# Patient Record
Sex: Female | Born: 1995 | Race: Black or African American | Hispanic: No | Marital: Single | Smoking: Never smoker
Health system: Southern US, Community
[De-identification: ages and names within clinical notes are randomized; demographics above are authoritative.]

## PROBLEM LIST (undated history)

## (undated) DIAGNOSIS — R0602 Shortness of breath: Secondary | ICD-10-CM

## (undated) DIAGNOSIS — R52 Pain, unspecified: Secondary | ICD-10-CM

## (undated) DIAGNOSIS — M2550 Pain in unspecified joint: Secondary | ICD-10-CM

## (undated) DIAGNOSIS — G479 Sleep disorder, unspecified: Secondary | ICD-10-CM

## (undated) DIAGNOSIS — M549 Dorsalgia, unspecified: Secondary | ICD-10-CM

## (undated) DIAGNOSIS — F32A Depression, unspecified: Secondary | ICD-10-CM

## (undated) DIAGNOSIS — E282 Polycystic ovarian syndrome: Secondary | ICD-10-CM

## (undated) DIAGNOSIS — F419 Anxiety disorder, unspecified: Secondary | ICD-10-CM

## (undated) DIAGNOSIS — F329 Major depressive disorder, single episode, unspecified: Secondary | ICD-10-CM

## (undated) DIAGNOSIS — R079 Chest pain, unspecified: Secondary | ICD-10-CM

## (undated) DIAGNOSIS — G4730 Sleep apnea, unspecified: Secondary | ICD-10-CM

## (undated) DIAGNOSIS — R600 Localized edema: Secondary | ICD-10-CM

## (undated) DIAGNOSIS — K259 Gastric ulcer, unspecified as acute or chronic, without hemorrhage or perforation: Secondary | ICD-10-CM

## (undated) DIAGNOSIS — R4581 Low self-esteem: Secondary | ICD-10-CM

## (undated) DIAGNOSIS — E559 Vitamin D deficiency, unspecified: Secondary | ICD-10-CM

## (undated) HISTORY — DX: Vitamin D deficiency, unspecified: E55.9

## (undated) HISTORY — DX: Polycystic ovarian syndrome: E28.2

## (undated) HISTORY — DX: Low self-esteem: R45.81

## (undated) HISTORY — DX: Sleep apnea, unspecified: G47.30

## (undated) HISTORY — PX: TONSILLECTOMY: SUR1361

## (undated) HISTORY — DX: Depression, unspecified: F32.A

## (undated) HISTORY — DX: Pain in unspecified joint: M25.50

## (undated) HISTORY — DX: Gastric ulcer, unspecified as acute or chronic, without hemorrhage or perforation: K25.9

## (undated) HISTORY — DX: Sleep disorder, unspecified: G47.9

## (undated) HISTORY — DX: Pain, unspecified: R52

## (undated) HISTORY — DX: Chest pain, unspecified: R07.9

## (undated) HISTORY — DX: Shortness of breath: R06.02

## (undated) HISTORY — DX: Localized edema: R60.0

## (undated) HISTORY — DX: Dorsalgia, unspecified: M54.9

## (undated) HISTORY — DX: Anxiety disorder, unspecified: F41.9

---
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
## 2015-11-22 ENCOUNTER — Emergency Department (HOSPITAL_COMMUNITY)
Admission: EM | Admit: 2015-11-22 | Discharge: 2015-11-22 | Disposition: A | Discharge status: Home / Self Care | Payer: Medicaid Other | Attending: Emergency Medicine | Admitting: Emergency Medicine

## 2015-11-22 ENCOUNTER — Emergency Department (HOSPITAL_COMMUNITY): Payer: Medicaid Other

## 2015-11-22 ENCOUNTER — Encounter (HOSPITAL_COMMUNITY): Payer: Self-pay | Admitting: Emergency Medicine

## 2015-11-22 ENCOUNTER — Ambulatory Visit (HOSPITAL_BASED_OUTPATIENT_CLINIC_OR_DEPARTMENT_OTHER)
Admit: 2015-11-22 | Discharge: 2015-11-22 | Disposition: A | Discharge status: Home / Self Care | Payer: Medicaid Other | Attending: Emergency Medicine | Admitting: Emergency Medicine

## 2015-11-22 DIAGNOSIS — M79662 Pain in left lower leg: Secondary | ICD-10-CM | POA: Diagnosis not present

## 2015-11-22 DIAGNOSIS — R2242 Localized swelling, mass and lump, left lower limb: Secondary | ICD-10-CM | POA: Insufficient documentation

## 2015-11-22 DIAGNOSIS — M79672 Pain in left foot: Secondary | ICD-10-CM | POA: Insufficient documentation

## 2015-11-22 DIAGNOSIS — M79609 Pain in unspecified limb: Secondary | ICD-10-CM | POA: Diagnosis not present

## 2015-11-22 DIAGNOSIS — M79605 Pain in left leg: Secondary | ICD-10-CM

## 2015-11-22 MED ORDER — ACETAMINOPHEN 500 MG PO TABS: 500.0000 mg | ORAL_TABLET | Freq: Four times a day (QID) | ORAL | Status: DC | PRN

## 2015-11-22 MED ORDER — ACETAMINOPHEN 325 MG PO TABS
650.0000 mg | ORAL_TABLET | Freq: Once | ORAL | Status: AC
  Administered 2015-11-22: 650 mg via ORAL
  Filled 2015-11-22: qty 2

## 2015-11-22 NOTE — ED Provider Notes (Signed)
CSN: [id number]     Arrival date & time 11/22/15  1540 History  By signing my name below, I, Iona Beard, attest that this documentation has been prepared under the direction and in the presence of Mady Gemma, PA-C.  Electronically Signed: Iona Beard, ED Scribe 11/22/2015 at 6:17 PM.    Chief Complaint  Patient presents with  . Foot Pain    The history is provided by the patient. No language interpreter was used.    HPI Comments: Colleen Webb is a 20 y.o. female who presents to the Emergency Department complaining of gradual onset, constant, left foot pain, onset four days ago. Pt reports the pain went away this weekend but came back worse this morning. She reports associated left foot swelling and left leg pain below the knee. The pain is worse on the top of her left foot. Pain is worsened with walking and shoots up her left leg. She states she has been working out more frequently the last two weeks and thinks it may be contributing to the pain. Tylenol taken at home with minimal relief to symptoms. No other worsening or alleviating factors noted. Pt denies numbness, tingling, weakness, or any other pertinent symptoms. No hx of cancer, recent travel, or recent surgery. She is not currently on birth control.   History reviewed. No pertinent past medical history. History reviewed. No pertinent past surgical history. No family history on file. Social History  Substance Use Topics  . Smoking status: Never Smoker   . Smokeless tobacco: None  . Alcohol Use: No   OB History    No data available      Review of Systems  Musculoskeletal: Positive for joint swelling and arthralgias.  Neurological: Negative for weakness and numbness.    Allergies  Review of patient's allergies indicates not on file.  Home Medications   Prior to Admission medications   Medication Sig Start Date End Date Taking? Authorizing Provider  acetaminophen (TYLENOL) 500 MG tablet Take 1  tablet (500 mg total) by mouth every 6 (six) hours as needed. 11/22/15   Mady Gemma, PA-C    BP 108/78 mmHg  Pulse 96  Temp(Src) 98.2 F (36.8 C) (Oral)  Resp 12  Ht  (1.549 m)  Wt 298 lb 5 oz (135.314 kg)  BMI 56.39 kg/m2  SpO2 96% Physical Exam  Constitutional: She is oriented to person, place, and time. She appears well-developed and well-nourished. No distress.  HENT:  Head: Normocephalic and atraumatic.  Right Ear: External ear normal.  Left Ear: External ear normal.  Nose: Nose normal.  Eyes: Conjunctivae and EOM are normal. Right eye exhibits no discharge. Left eye exhibits no discharge. No scleral icterus.  Neck: Normal range of motion. Neck supple.  Cardiovascular: Normal rate and regular rhythm.   Pulmonary/Chest: Effort normal and breath sounds normal. No respiratory distress.  Musculoskeletal: Normal range of motion. She exhibits edema and tenderness.  Edema to LLE. Diffuse TTP to LLE from inferior aspect of knee to her foot. No erythema or heat. Full ROM. Distal pulses intact.  Neurological: She is alert and oriented to person, place, and time.  Skin: Skin is warm and dry. She is not diaphoretic.  Psychiatric: She has a normal mood and affect. Her behavior is normal.  Nursing note and vitals reviewed.   ED Course  Procedures (including critical care time)  DIAGNOSTIC STUDIES: Oxygen Saturation is 96% on RA, normal by my interpretation.    COORDINATION OF CARE: 4:08 PM-Discussed  treatment plan which includes tylenol, DG ankle complete left, DG foot complete left, and DG tibia/fibula left with pt at bedside and pt agreed to plan.   Labs Review Labs Reviewed - No data to display  Imaging Review Dg Tibia/fibula Left  11/22/2015  CLINICAL DATA:  Left lower extremity pain for 4 days no known injury. EXAM: LEFT TIBIA AND FIBULA - 2 VIEW COMPARISON:  None. FINDINGS: Imaging of the lower shin and ankle is included on dedicated ankle radiography. There  is no evidence of fracture or focal bone lesion. Negative soft tissues. IMPRESSION: Negative. Electronically Signed   By: Marnee SpringJonathon  Watts M.D.   On: 11/22/2015 16:55   Dg Ankle Complete Left  11/22/2015  CLINICAL DATA:  Left ankle and foot pain for 4 days. No known injury. EXAM: LEFT FOOT - COMPLETE 3+ VIEW; LEFT ANKLE COMPLETE - 3+ VIEW COMPARISON:  None. FINDINGS: Left ankle: The ankle mortise is maintained. No ankle fracture or osteochondral abnormality. No definite ankle joint effusion. The mid and hindfoot bony structures are intact. Left foot: The joint spaces are maintained. No acute bony findings or arthropathic changes. No osteochondral abnormality. IMPRESSION: Normal left foot and ankle radiographs. Electronically Signed   By: Rudie MeyerP.  Gallerani M.D.   On: 11/22/2015 16:56   Dg Foot Complete Left  11/22/2015  CLINICAL DATA:  Left ankle and foot pain for 4 days. No known injury. EXAM: LEFT FOOT - COMPLETE 3+ VIEW; LEFT ANKLE COMPLETE - 3+ VIEW COMPARISON:  None. FINDINGS: Left ankle: The ankle mortise is maintained. No ankle fracture or osteochondral abnormality. No definite ankle joint effusion. The mid and hindfoot bony structures are intact. Left foot: The joint spaces are maintained. No acute bony findings or arthropathic changes. No osteochondral abnormality. IMPRESSION: Normal left foot and ankle radiographs. Electronically Signed   By: Rudie MeyerP.  Gallerani M.D.   On: 11/22/2015 16:56   I have personally reviewed and evaluated these images as part of my medical decision-making.   EKG Interpretation None      MDM   Final diagnoses:  Pain of left lower extremity    20 year old female presents with diffuse LLE pain x 4 days, worse today. Patient is afebrile. Vital signs stable. On exam, she has diffuse TTP to the anterior and posterior aspects of her LLE from the inferior aspect of her knee to her foot with associated edema. No erythema or heat. No palpable cords or varicosities. Full ROM.  Patient is NVI.  Will obtain imaging and DVT study. Will give tylenol.  Imaging of left lower extremity negative for fracture, dislocation, effusion, soft tissue abnormality. DVT study negative.  Discussed findings with patient. Symptoms likely muscular - after speaking with patient further, she states she has been exercising more frequently than normal and thinks this may have precipitated her symptoms. Advised to rest, ice, elevate, and take tylenol or ibuprofen for pain. Patient to follow-up with PCP. Return precautions discussed. Patient verbalizes her understanding and is in agreement with plan.  BP 108/78 mmHg  Pulse 96  Temp(Src) 98.2 F (36.8 C) (Oral)  Resp 12  Ht 5\' 1"  (1.549 m)  Wt 135.314 kg  BMI 56.39 kg/m2  SpO2 96%  LMP      Mady Gemmalizabeth C Westfall, PA-C 11/22/15 1818  Gerhard Munchobert Lockwood, MD 11/22/15 1930

## 2015-11-22 NOTE — ED Notes (Signed)
CONFIRMED PROCEDURE WITH VASCULAR TECH; MAKE SURE PANTS ARE OFF

## 2015-11-22 NOTE — ED Notes (Signed)
CONFIRMED WITH VASCULAR TECH; MAKE SURE PANTS ARE OFF

## 2015-11-22 NOTE — Discharge Instructions (Signed)
1. Medications: tylenol or ibuprofen for pain, usual home medications 2. Treatment: rest, drink plenty of fluids, ice, elevate 3. Follow Up: please followup with your primary doctor for discussion of your diagnoses and further evaluation after today's visit; if you do not have a primary care doctor use the phone number listed in your discharge paperwork to find one; please return to the ER for increased pain or swelling, numbness, new or worsening symptoms   Musculoskeletal Pain Musculoskeletal pain is muscle and boney aches and pains. These pains can occur in any part of the body. Your caregiver may treat you without knowing the cause of the pain. They may treat you if blood or urine tests, X-rays, and other tests were normal.  CAUSES There is often not a definite cause or reason for these pains. These pains may be caused by a type of germ (virus). The discomfort may also come from overuse. Overuse includes working out too hard when your body is not fit. Boney aches also come from weather changes. Bone is sensitive to atmospheric pressure changes. HOME CARE INSTRUCTIONS   Ask when your test results will be ready. Make sure you get your test results.  Only take over-the-counter or prescription medicines for pain, discomfort, or fever as directed by your caregiver. If you were given medications for your condition, do not drive, operate machinery or power tools, or sign legal documents for 24 hours. Do not drink alcohol. Do not take sleeping pills or other medications that may interfere with treatment.  Continue all activities unless the activities cause more pain. When the pain lessens, slowly resume normal activities. Gradually increase the intensity and duration of the activities or exercise.  During periods of severe pain, bed rest may be helpful. Lay or sit in any position that is comfortable.  Putting ice on the injured area.  Put ice in a bag.  Place a towel between your skin and the  bag.  Leave the ice on for 15 to 20 minutes, 3 to 4 times a day.  Follow up with your caregiver for continued problems and no reason can be found for the pain. If the pain becomes worse or does not go away, it may be necessary to repeat tests or do additional testing. Your caregiver may need to look further for a possible cause. SEEK IMMEDIATE MEDICAL CARE IF:  You have pain that is getting worse and is not relieved by medications.  You develop chest pain that is associated with shortness or breath, sweating, feeling sick to your stomach (nauseous), or throw up (vomit).  Your pain becomes localized to the abdomen.  You develop any new symptoms that seem different or that concern you. MAKE SURE YOU:   Understand these instructions.  Will watch your condition.  Will get help right away if you are not doing well or get worse.   This information is not intended to replace advice given to you by your health care provider. Make sure you discuss any questions you have with your health care provider.   Document Released: 08/13/2005 Document Revised: 11/05/2011 Document Reviewed: 04/17/2013 Elsevier Interactive Patient Education [employer] Inc[employer]

## 2015-11-22 NOTE — ED Notes (Signed)
Doppler at bedside at this time

## 2015-11-22 NOTE — ED Notes (Signed)
Pt c/o pain in left foot up to knee.  St's onset last Fri.  Pt denies any injury or recent travel.  Pt increases with ambulation

## 2015-11-22 NOTE — Progress Notes (Signed)
*  PRELIMINARY RESULTS* Vascular Ultrasound Left lower extremity venous duplex has been completed.  Preliminary findings: No evidence of DVT or baker's cyst.   Farrel DemarkJill Eunice, RDMS, RVT  11/22/2015, 6:12 PM

## 2016-03-08 ENCOUNTER — Ambulatory Visit (INDEPENDENT_AMBULATORY_CARE_PROVIDER_SITE_OTHER): Payer: Self-pay | Admitting: Obstetrics & Gynecology

## 2016-03-08 ENCOUNTER — Ambulatory Visit: Payer: Medicaid Other

## 2016-03-08 ENCOUNTER — Encounter: Payer: Self-pay | Admitting: Obstetrics & Gynecology

## 2016-03-08 VITALS — BP 109/68 | HR 85 | Resp 16 | Ht 61.0 in | Wt 297.0 lb

## 2016-03-08 DIAGNOSIS — N912 Amenorrhea, unspecified: Secondary | ICD-10-CM | POA: Insufficient documentation

## 2016-03-08 DIAGNOSIS — E282 Polycystic ovarian syndrome: Secondary | ICD-10-CM

## 2016-03-08 MED ORDER — METFORMIN HCL 1000 MG PO TABS: ORAL_TABLET | ORAL | Status: DC

## 2016-03-08 MED ORDER — MEDROXYPROGESTERONE ACETATE 10 MG PO TABS: 10.0000 mg | ORAL_TABLET | Freq: Every day | ORAL | Status: DC

## 2016-03-08 NOTE — Progress Notes (Signed)
CLINIC ENCOUNTER NOTE  History:  20 y.o. G0P0000 here today to establish care and discuss management of PCOS.  She is accompanied by her mother.  She reports amenorrhea for over one year; was diagnosed with PCOS several years ago. Has been on OCPs and Metformin in the past, did not like side effects of OCPs.  No current medication.  She has started a diet and exercise program and is very committed to it. She denies any abnormal vaginal discharge, pelvic pain or other concerns.   Past Medical History  Diagnosis Date  . PCOS (polycystic ovarian syndrome)     History reviewed. No pertinent past surgical history.  The following portions of the patient's history were reviewed and updated as appropriate: allergies, current medications, past family history, past medical history, past social history, past surgical history and problem list.   Health Maintenance:  Has received HPV vaccine  Review of Systems:  Pertinent items noted in HPI and remainder of comprehensive ROS otherwise negative.  Objective:  Physical Exam BP 109/68 mmHg  Pulse 85  Resp 16  Ht  (1.549 m)  Wt 297 lb (134.718 kg)  BMI 56.15 kg/m2 CONSTITUTIONAL: Well-developed, well-nourished female in no acute distress.  HENT:  Normocephalic, atraumatic. External right and left ear normal. Oropharynx is clear and moist EYES: Conjunctivae and EOM are normal. Pupils are equal, round, and reactive to light. No scleral icterus.  NECK: Normal range of motion, supple, no masses SKIN: Skin is warm and dry. No rash noted. Not diaphoretic. No erythema. No pallor. NEUROLOGIC: Alert and oriented to person, place, and time. Normal reflexes, muscle tone coordination. No cranial nerve deficit noted. PSYCHIATRIC: Normal mood and affect. Normal behavior. Normal judgment and thought content. CARDIOVASCULAR: Normal heart rate noted RESPIRATORY: Effort and breath sounds normal, no problems with respiration noted ABDOMEN: Soft, obese, no  distention noted.   PELVIC: Deferred MUSCULOSKELETAL: Normal range of motion. No edema noted.    Assessment & Plan:  1. PCOS (polycystic ovarian syndrome) 2. Amenorrhea Provera prescribed to induce period. Counseled patient about management of PCOS, commended her on weight loss efforts as this will help with restoring ovulatory cycles, decreases glucose intolerance with improvement of metabolic risk, improves fertility/pregnancy rates and helps with overall health.  Even modest weight loss (5 to 10 percent reduction in body weight) in women with PCOS may result in these effects.  OCPs are also the mainstay of pharmacologic therapy for women with PCOS for managing hyperandrogenism and menstrual dysfunction and for providing contraception; but patient declined this option. PCOS is also treated with Metformin given its association with glucose intolerance and insulin resistance.  Over 50% of PCOS patients on  of Metformin daily have been shown to ovulate successfully. Common side effects include GI intolerance, kidney and liver enzyme irregularities, lactic acidosis. She will also be instructed to stop therapy if she anticipates major stresses, such as surgery or IVF, in order to avoid lactic acidosis. She was prescribed Metformin 1000 mg po daily for one week. If she tolerates this, she can then be switched to 850 mg po bid or 1000 mg po bid.  Will continue to follow.  Patient will return in 3 months for followup and CMET check.  - metFORMIN (GLUCOPHAGE) 1000 MG tablet; Take one tablet by mouth daily for one week. Then increase to one tablet twice a day.  Dispense: 60 tablet; Refill: 3 - medroxyPROGESTERone (PROVERA) 10 MG tablet; Take 1 tablet (10 mg total) by mouth daily. Use for  ten days  Dispense: 10 tablet; Refill: 2 Routine preventative health maintenance measures emphasized. Please refer to After Visit Summary for other counseling recommendations.   Total face-to-face time with patient:  20 minutes. Over 50% of encounter was spent on counseling and coordination of care.   Jaynie CollinsUGONNA  Mattie Novosel, MD, FACOG Attending Obstetrician & Gynecologist, [facility]

## 2016-03-08 NOTE — Patient Instructions (Addendum)
Thank you for enrolling in MyChart. Please follow the instructions below to securely access your online medical record. MyChart allows you to send messages to your doctor, view your test results, renew your prescriptions, schedule appointments, and more.  How Do I Sign Up? 1. In your Internet browser, go to [email or web address]. 2. Click on the New  User? link in the Sign In box.  3. Enter your MyChart Access Code exactly as it appears below. You will not need to use this code after you have completed the sign-up process. If you do not sign up before the expiration date, you must request a new code. MyChart Access Code: [email or web address] Expires: 03/30/2016  3:09 AM  4. Enter the last four digits of your Social Security Number (xxxx) and Date of Birth (mm/dd/yyyy) as indicated and click Next. You will be taken to the next sign-up page. 5. Create a MyChart ID. This will be your MyChart login ID and cannot be changed, so think of one that is secure and easy to remember. 6. Create a MyChart password. You can change your password at any time. 7. Enter your Password Reset Question and Answer and click Next. This can be used at a later time if you forget your password.  8. Select your communication preference, and if applicable enter your e-mail address. You will receive e-mail notification when new information is available in MyChart by choosing to receive e-mail notifications and filling in your e-mail. 9. Click Sign In. You can now view your medical record.   Additional Information If you have questions, you can email [email or web address] or call [phone] to talk to our MyChart staff. Remember, MyChart is NOT to be used for urgent needs. For medical emergencies, dial 911.  Polycystic Ovarian Syndrome Polycystic ovarian syndrome (PCOS) is a common hormonal disorder among women of reproductive age. Most women with PCOS grow many small cysts on their ovaries. PCOS can  cause problems with your periods and make it difficult to get pregnant. It can also cause an increased risk of miscarriage with pregnancy. If left untreated, PCOS can lead to serious health problems, such as diabetes and heart disease. CAUSES The cause of PCOS is not fully understood, but genetics may be a factor. SIGNS AND SYMPTOMS   Infrequent or no menstrual periods.   Inability to get pregnant (infertility) because of not ovulating.   Increased growth of hair on the face, chest, stomach, back, thumbs, thighs, or toes.   Acne, oily skin, or dandruff.   Pelvic pain.   Weight gain or obesity, usually carrying extra weight around the waist.   Type 2 diabetes.   High cholesterol.   High blood pressure.   Female-pattern baldness or thinning hair.   Patches of thickened and dark brown or black skin on the neck, arms, breasts, or thighs.   Tiny excess flaps of skin (skin tags) in the armpits or neck area.   Excessive snoring and having breathing stop at times while asleep (sleep apnea).   Deepening of the voice.   Gestational diabetes when pregnant.  DIAGNOSIS  There is no single test to diagnose PCOS.   Your health care provider will:   Take a medical history.   Perform a pelvic exam.   Have ultrasonography done.   Check your female and female hormone levels.   Measure glucose or sugar levels in the blood.   Do other blood tests.   If you are producing too many female hormones, your  health care provider will make sure it is from PCOS. At the physical exam, your health care provider will want to evaluate the areas of increased hair growth. Try to allow natural hair growth for a few days before the visit.   During a pelvic exam, the ovaries may be enlarged or swollen because of the increased number of small cysts. This can be seen more easily by using vaginal ultrasonography or screening to examine the ovaries and lining of the uterus (endometrium)  for cysts. The uterine lining may become thicker if you have not been having a regular period.  TREATMENT  Because there is no cure for PCOS, it needs to be managed to prevent problems. Treatments are based on your symptoms. Treatment is also based on whether you want to have a baby or whether you need contraception.  Treatment may include:   Progesterone hormone to start a menstrual period.   Birth control pills to make you have regular menstrual periods.   Medicines to make you ovulate, if you want to get pregnant.   Medicines to control your insulin.   Medicine to control your blood pressure.   Medicine and diet to control your high cholesterol and triglycerides in your blood.  Medicine to reduce excessive hair growth.  Surgery, making small holes in the ovary, to decrease the amount of female hormone production. This is done through a long, lighted tube (laparoscope) placed into the pelvis through a tiny incision in the lower abdomen.  HOME CARE INSTRUCTIONS  Only take over-the-counter or prescription medicine as directed by your health care provider.  Pay attention to the foods you eat and your activity levels. This can help reduce the effects of PCOS.  Keep your weight under control.  Eat foods that are low in carbohydrate and high in fiber.  Exercise regularly. SEEK MEDICAL CARE IF:  Your symptoms do not get better with medicine.  You have new symptoms.   This information is not intended to replace advice given to you by your health care provider. Make sure you discuss any questions you have with your health care provider.   Document Released: 12/07/2004 Document Revised: 06/03/2013 Document Reviewed: 01/29/2013 Elsevier Interactive Patient Education [employer]

## 2016-10-17 ENCOUNTER — Emergency Department (HOSPITAL_COMMUNITY)
Admission: EM | Admit: 2016-10-17 | Discharge: 2016-10-17 | Disposition: A | Discharge status: Home / Self Care | Payer: Medicaid Other | Attending: Emergency Medicine | Admitting: Emergency Medicine

## 2016-10-17 ENCOUNTER — Encounter (HOSPITAL_COMMUNITY): Payer: Self-pay | Admitting: Emergency Medicine

## 2016-10-17 DIAGNOSIS — Z79899 Other long term (current) drug therapy: Secondary | ICD-10-CM | POA: Insufficient documentation

## 2016-10-17 DIAGNOSIS — L509 Urticaria, unspecified: Secondary | ICD-10-CM | POA: Insufficient documentation

## 2016-10-17 MED ORDER — FAMOTIDINE 20 MG PO TABS
20.0000 mg | ORAL_TABLET | Freq: Once | ORAL | Status: AC
  Administered 2016-10-17: 20 mg via ORAL
  Filled 2016-10-17: qty 1

## 2016-10-17 MED ORDER — DIPHENHYDRAMINE HCL 25 MG PO CAPS
50.0000 mg | ORAL_CAPSULE | Freq: Once | ORAL | Status: AC
  Administered 2016-10-17: 50 mg via ORAL
  Filled 2016-10-17: qty 2

## 2016-10-17 NOTE — Discharge Instructions (Signed)
1 to 2 tablets of 25 mg Benadryl pills every 4-6 hours as needed to a maximum of 300 mg per day. In addition, you may apply a topical hydrocortisone ointment to all affected areas except for the face.  ° °Do not hesitate to call 911 or return to the emergency room if you develop any shortness of breath, wheezing, tongue or lip swelling. ° °

## 2016-10-17 NOTE — ED Triage Notes (Signed)
Pt from home with complaints of urticaria on her chest, arms, and back. Pt denies SOB or any swelling in her airway. Pt states she ate pesto for the first time yesterday. Pt has clear lung sounds. Pt took benadryl yesterday, but nothing today. Pt states the benadryl helped. Pt states hives have gotten worse as the day has gone on

## 2016-10-18 NOTE — ED Provider Notes (Signed)
[facility]-EMERGENCY DEPT Provider Note   CSN: [id number] Arrival date & time: 10/17/16  2206     History   Chief Complaint Chief Complaint  Patient presents with  . Urticaria     HPI   Blood pressure 109/63, pulse 101, temperature 99.1 F (37.3 C), temperature source Oral, resp. rate 18, height 5\' 1"  (1.549 m), weight 132 kg, last menstrual period 04/16/2016, SpO2 98 %.  Colleen Webb is a 21 y.o. female complaining of Pruritic urticarial rash onset yesterday. She denies any new soaps, lotions, detergents, medications, pets, foods. Also denies nausea, vomiting, lip or tongue swelling. No history of anaphylactic reaction. She took Benadryl yesterday with some relief. Affected areas are above the waist only.  Past Medical History:  Diagnosis Date  . PCOS (polycystic ovarian syndrome)     Patient Active Problem List   Diagnosis Date Noted  . PCOS (polycystic ovarian syndrome) 03/08/2016  . Amenorrhea 03/08/2016    History reviewed. No pertinent surgical history.  OB History    Gravida Para Term Preterm AB Living   0 0 0 0 0 0   SAB TAB Ectopic Multiple Live Births   0 0 0 0         Home Medications    Prior to Admission medications   Medication Sig Start Date End Date Taking? Authorizing Provider  medroxyPROGESTERone (PROVERA) 10 MG tablet Take 1 tablet (10 mg total) by mouth daily. Use for ten days 03/08/16   Tereso Newcomer, MD  metFORMIN (GLUCOPHAGE) 1000 MG tablet Take one tablet by mouth daily for one week. Then increase to one tablet twice a day. 03/08/16   Tereso Newcomer, MD    Family History Family History  Problem Relation Age of Onset  . Diabetes Maternal Grandfather   . Diabetes Paternal Grandfather     Social History Social History  Substance Use Topics  . Smoking status: Never Smoker  . Smokeless tobacco: Never Used  . Alcohol use No     Allergies   Patient has no known allergies.   Review of Systems Review of Systems  10 systems  reviewed and found to be negative, except as noted in the HPI.    Physical Exam Updated Vital Signs BP 109/63 (BP Location: Left Arm)   Pulse 101   Temp 99.1 F (37.3 C) (Oral)   Resp 18   Ht 5\' 1"  (1.549 m)   Wt 132 kg   LMP 04/16/2016 Comment: Pt on hormone therapy   SpO2 98%   BMI 54.98 kg/m   Physical Exam  Constitutional: She is oriented to person, place, and time. She appears well-developed and well-nourished. No distress.  HENT:  Head: Normocephalic and atraumatic.  Mouth/Throat: Oropharynx is clear and moist.  Eyes: Conjunctivae and EOM are normal. Pupils are equal, round, and reactive to light.  Neck: Normal range of motion.  Cardiovascular: Normal rate, regular rhythm and intact distal pulses.   Pulmonary/Chest: Effort normal and breath sounds normal.  Abdominal: Soft. She exhibits no distension and no mass. There is no tenderness. There is no rebound and no guarding. No hernia.  Musculoskeletal: Normal range of motion.  Neurological: She is alert and oriented to person, place, and time.  Skin: Skin is warm. Capillary refill takes less than 2 seconds. Rash noted. She is not diaphoretic.  Uricareal rash to bilateral upper extremities and torso.  Psychiatric: She has a normal mood and affect.  Nursing note and vitals reviewed.    ED Treatments /  Results  Labs (all labs ordered are listed, but only abnormal results are displayed) Labs Reviewed - No data to display  EKG  EKG Interpretation None       Radiology No results found.  Procedures Procedures (including critical care time)  Medications Ordered in ED Medications  famotidine (PEPCID) tablet 20 mg (20 mg Oral Given 10/17/16 2309)  diphenhydrAMINE (BENADRYL) capsule 50 mg (50 mg Oral Given 10/17/16 2309)     Initial Impression / Assessment and Plan / ED Course  I have reviewed the triage vital signs and the nursing notes.  Pertinent labs & imaging results that were available during my care of  the patient were reviewed by me and considered in my medical decision making (see chart for details).     Vitals:   10/17/16 2218  BP: 109/63  Pulse: 101  Resp: 18  Temp: 99.1 F (37.3 C)  TempSrc: Oral  SpO2: 98%  Weight: 132 kg  Height: 5\' 1"  (1.549 m)    Medications  famotidine (PEPCID) tablet 20 mg (20 mg Oral Given 10/17/16 2309)  diphenhydrAMINE (BENADRYL) capsule 50 mg (50 mg Oral Given 10/17/16 2309)    Colleen Webb is 21 y.o. female presenting with hives. No signs of secondary organ involvement. Counseled patient on applying hydrocortisone ointment  Evaluation does not show pathology that would require ongoing emergent intervention or inpatient treatment. Pt is hemodynamically stable and mentating appropriately. Discussed findings and plan with patient/guardian, who agrees with care plan. All questions answered. Return precautions discussed and outpatient follow up given.      Final Clinical Impressions(s) / ED Diagnoses   Final diagnoses:  Urticaria    New Prescriptions Discharge Medication List as of 10/17/2016 10:58 PM       Wynetta EmeryNicole Wymon Swaney, PA-C 10/18/16 0030    Rolland PorterMark James, MD 10/29/16 2005

## 2017-02-17 IMAGING — CR DG ANKLE COMPLETE 3+V*L*
3 series · 3 of 3 positions shown · non-contrast
Comparison: None.

CLINICAL DATA: Left ankle and foot pain for 4 days. No known
injury.

EXAM:
LEFT FOOT - COMPLETE 3+ VIEW; LEFT ANKLE COMPLETE - 3+ VIEW

[ankle ap]
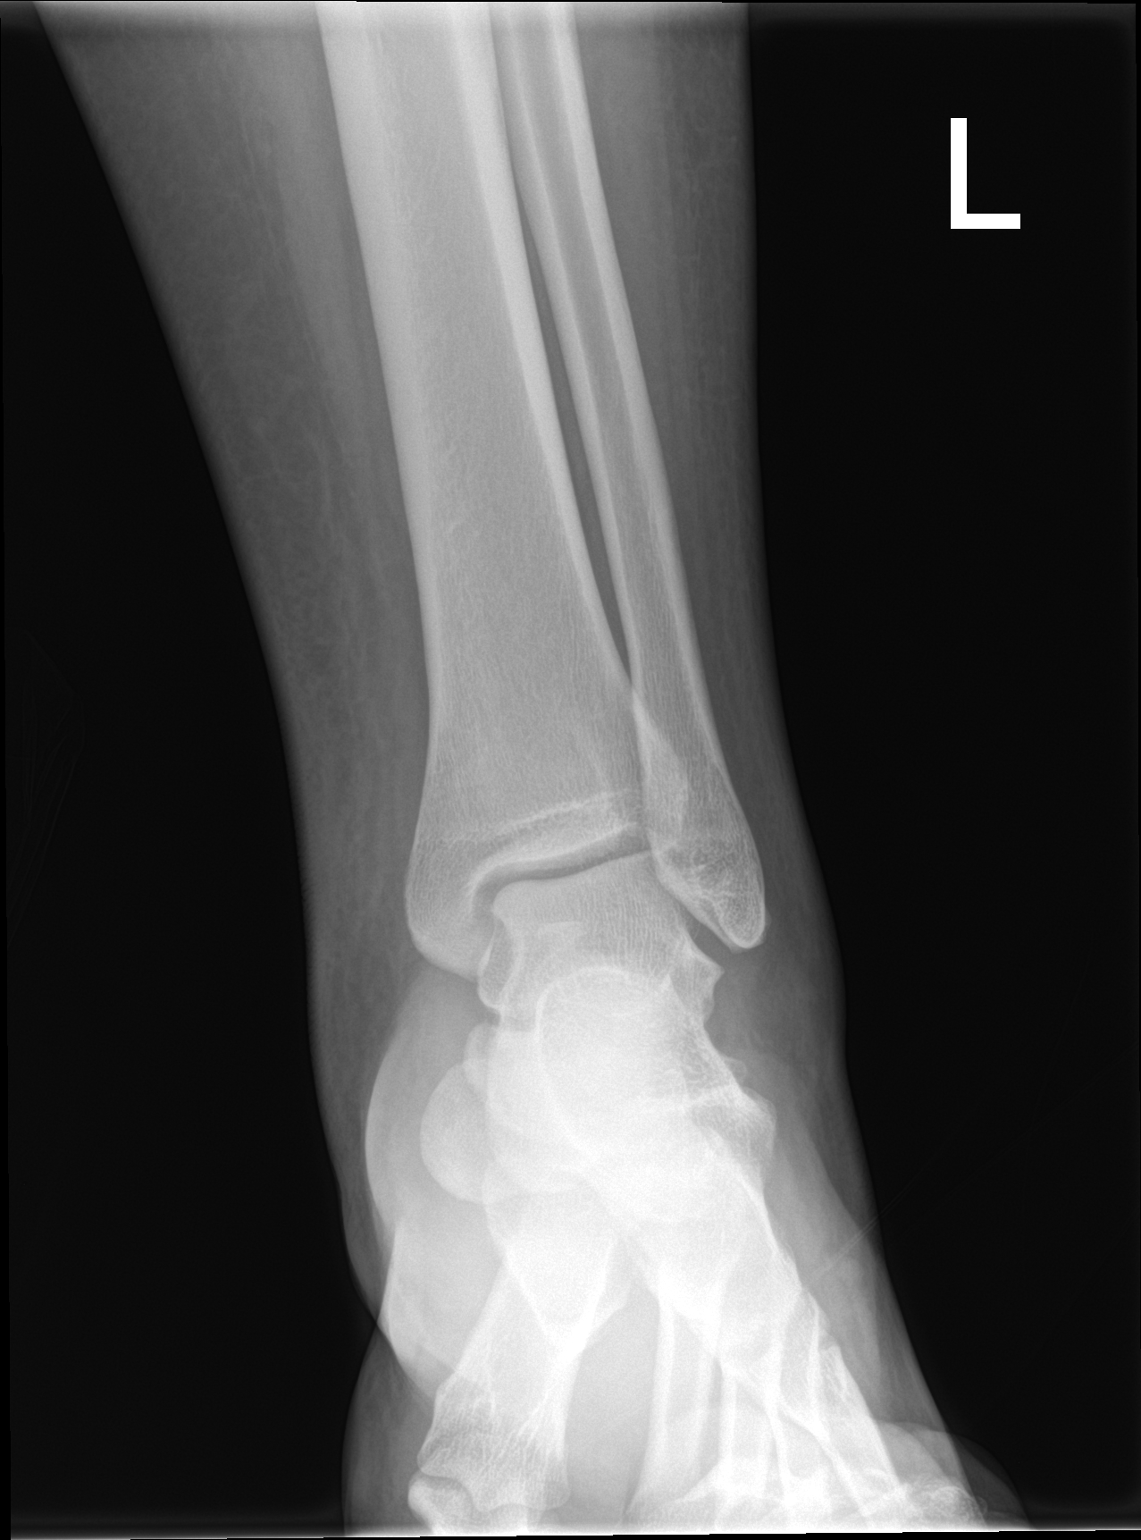

[ankle obl]
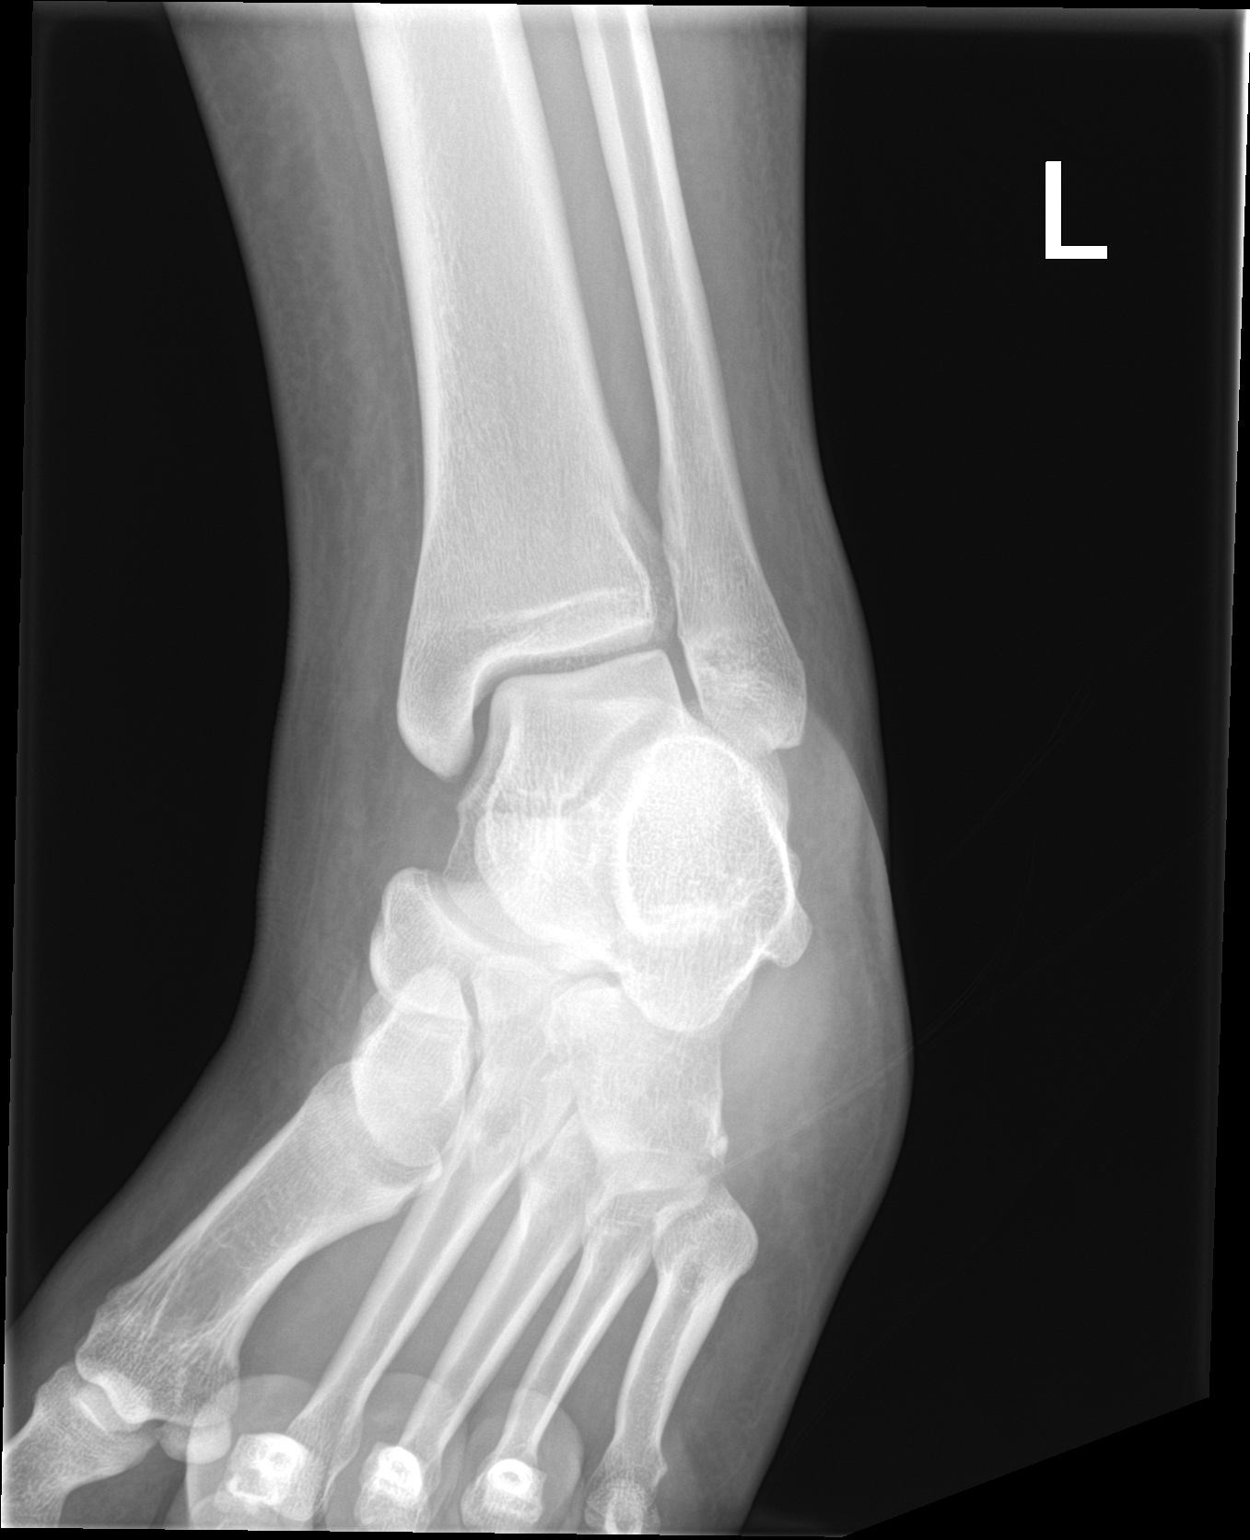

[ankle lat]
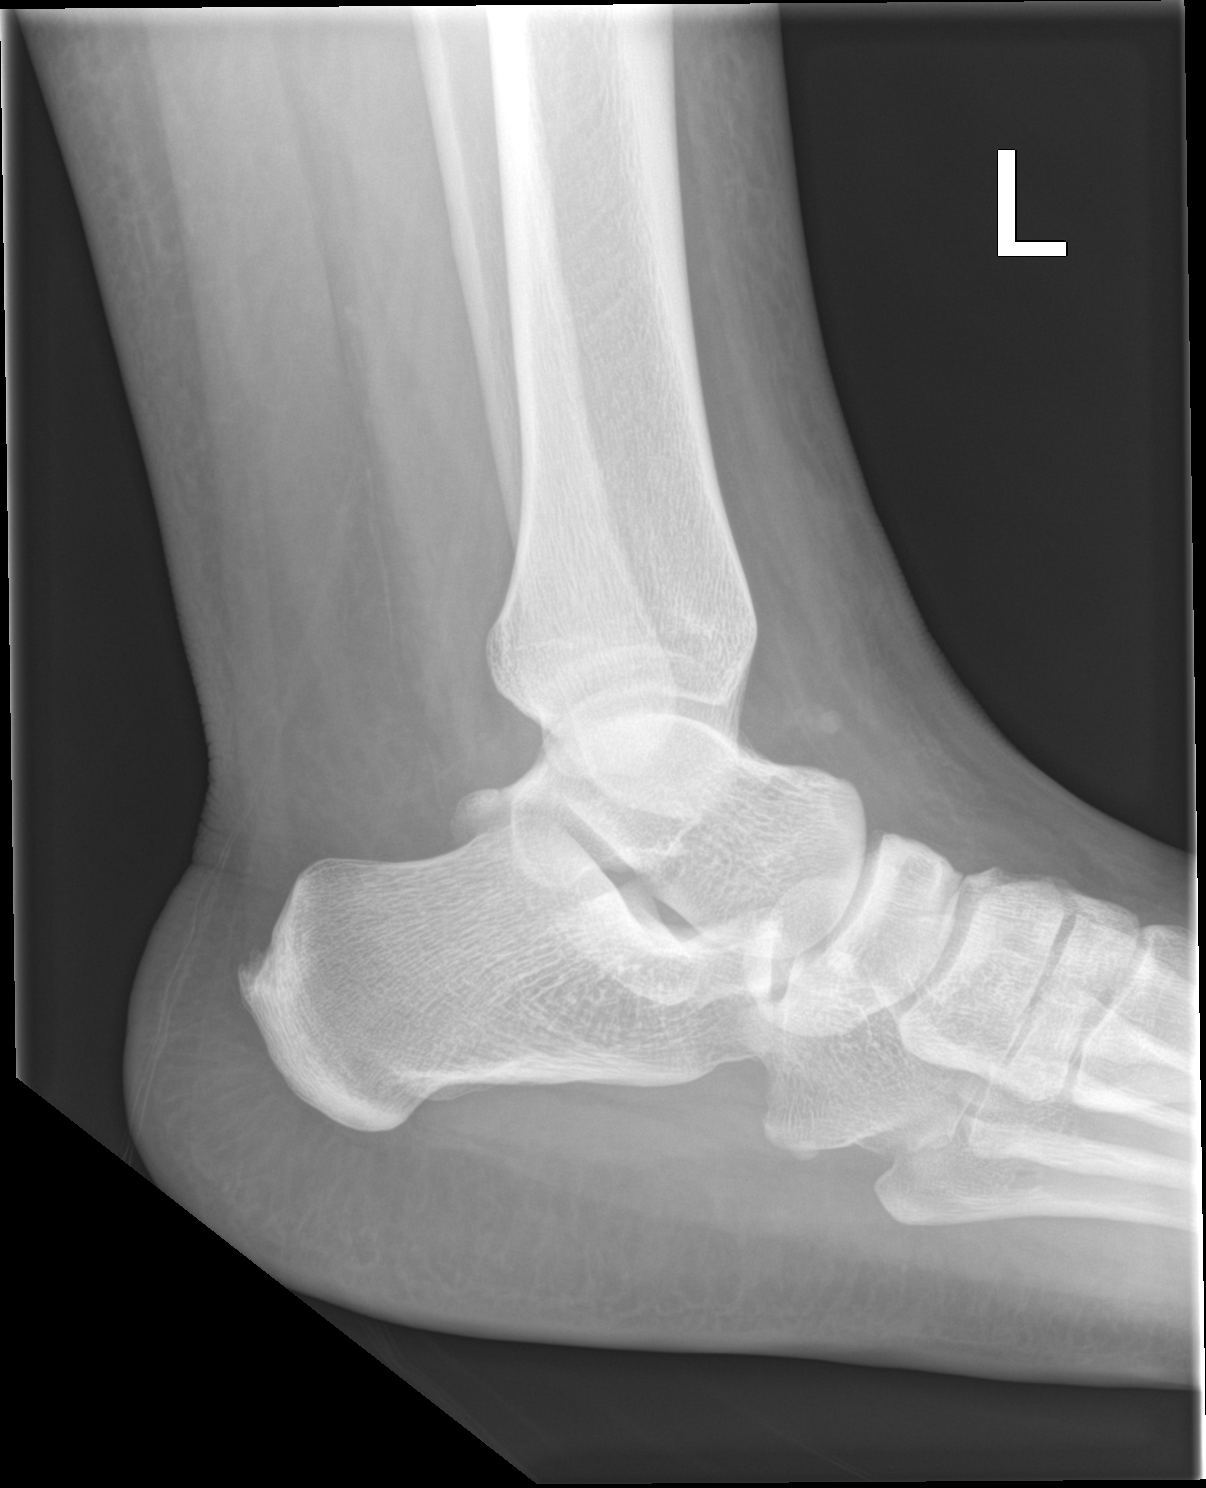

[3 of 3 positions shown; findings below may reference images not displayed]

FINDINGS: Left ankle:

The ankle mortise is maintained. No ankle fracture or osteochondral
abnormality. No definite ankle joint effusion. The mid and hindfoot
bony structures are intact.

Left foot:

The joint spaces are maintained. No acute bony findings or
arthropathic changes. No osteochondral abnormality.
IMPRESSION: Normal left foot and ankle radiographs.

## 2017-02-17 IMAGING — CR DG FOOT COMPLETE 3+V*L*
3 series · 3 of 3 positions shown · non-contrast
Comparison: None.

CLINICAL DATA: Left ankle and foot pain for 4 days. No known
injury.

EXAM:
LEFT FOOT - COMPLETE 3+ VIEW; LEFT ANKLE COMPLETE - 3+ VIEW

[foot ap]
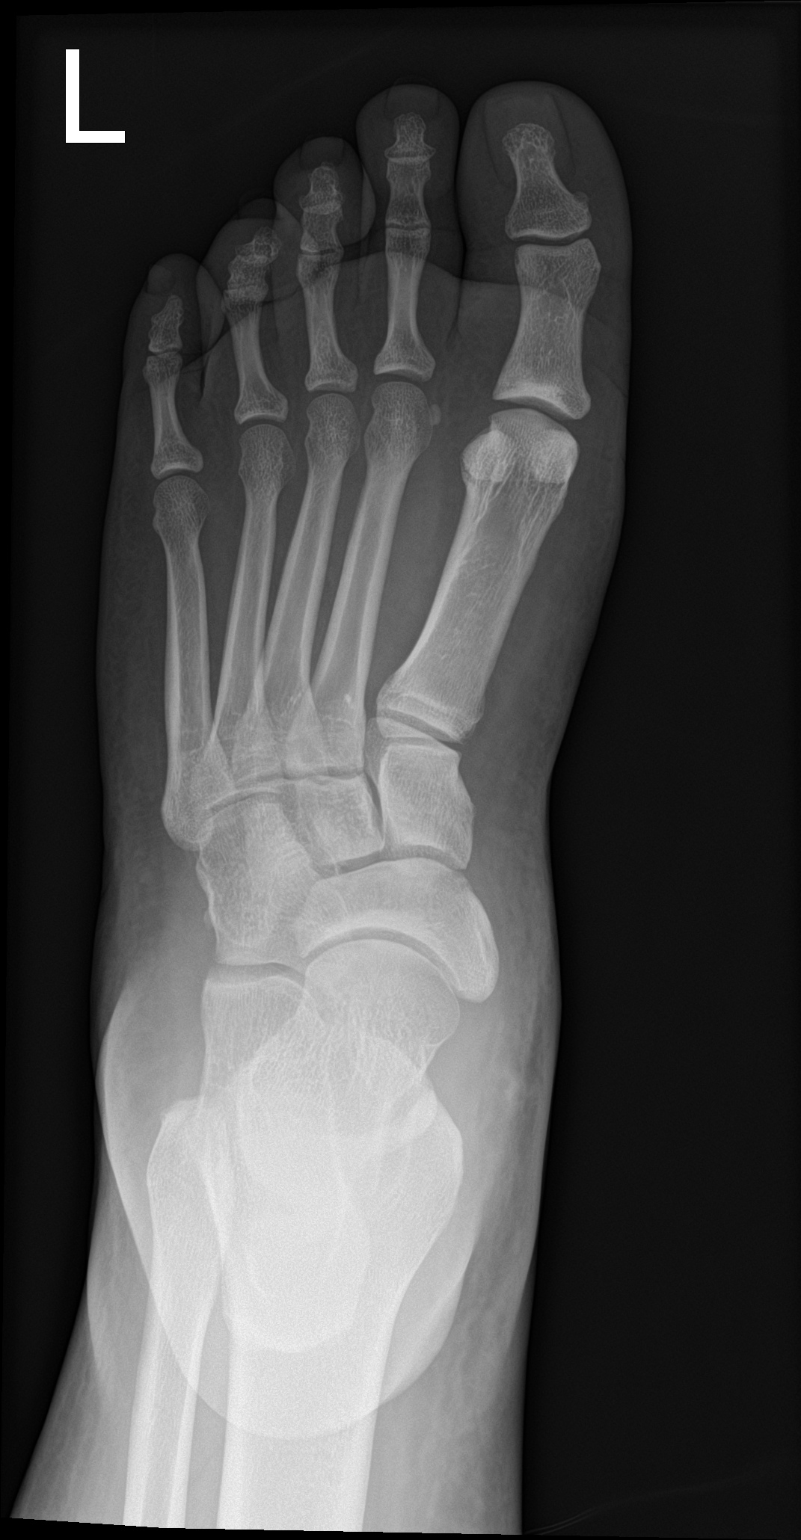

[foot obl]
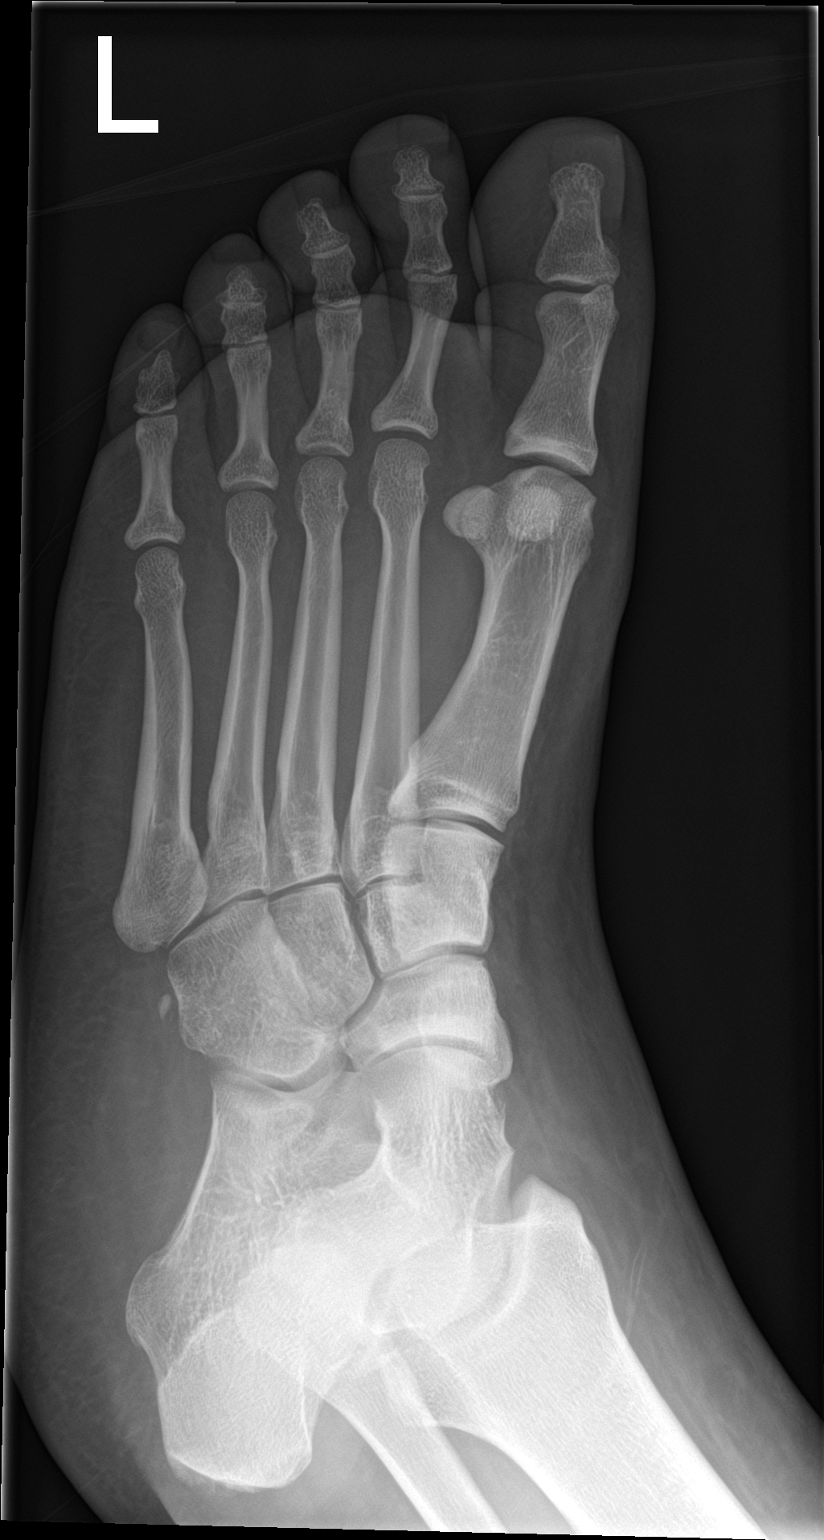

[foot lat]
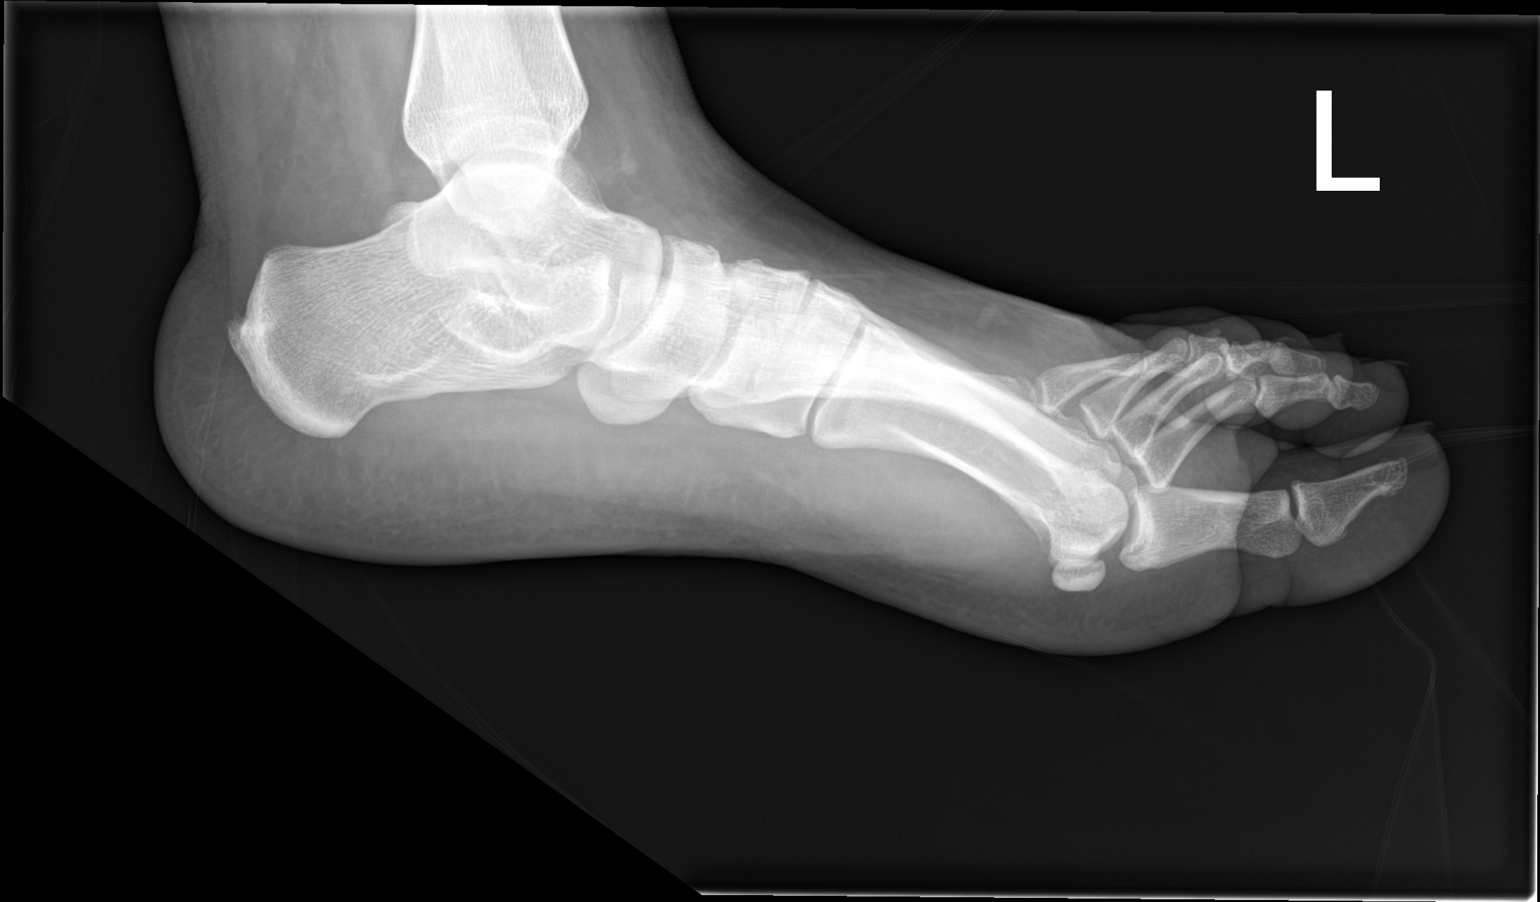

[3 of 3 positions shown; findings below may reference images not displayed]

FINDINGS: Left ankle:

The ankle mortise is maintained. No ankle fracture or osteochondral
abnormality. No definite ankle joint effusion. The mid and hindfoot
bony structures are intact.

Left foot:

The joint spaces are maintained. No acute bony findings or
arthropathic changes. No osteochondral abnormality.
IMPRESSION: Normal left foot and ankle radiographs.

## 2018-07-27 ENCOUNTER — Emergency Department (HOSPITAL_COMMUNITY)
Admission: EM | Admit: 2018-07-27 | Discharge: 2018-07-27 | Disposition: A | Discharge status: Home / Self Care | Payer: BLUE CROSS/BLUE SHIELD | Attending: Emergency Medicine | Admitting: Emergency Medicine

## 2018-07-27 ENCOUNTER — Encounter: Payer: Self-pay | Admitting: Emergency Medicine

## 2018-07-27 ENCOUNTER — Emergency Department (HOSPITAL_COMMUNITY): Payer: BLUE CROSS/BLUE SHIELD

## 2018-07-27 DIAGNOSIS — R05 Cough: Secondary | ICD-10-CM | POA: Diagnosis present

## 2018-07-27 DIAGNOSIS — Z79899 Other long term (current) drug therapy: Secondary | ICD-10-CM | POA: Diagnosis not present

## 2018-07-27 DIAGNOSIS — J069 Acute upper respiratory infection, unspecified: Secondary | ICD-10-CM | POA: Insufficient documentation

## 2018-07-27 DIAGNOSIS — Z7984 Long term (current) use of oral hypoglycemic drugs: Secondary | ICD-10-CM | POA: Insufficient documentation

## 2018-07-27 MED ORDER — FLUTICASONE PROPIONATE 50 MCG/ACT NA SUSP: 1.0000 | Freq: Every day | NASAL | 2 refills | Status: DC

## 2018-07-27 MED ORDER — ALBUTEROL SULFATE HFA 108 (90 BASE) MCG/ACT IN AERS: 1.0000 | INHALATION_SPRAY | Freq: Four times a day (QID) | RESPIRATORY_TRACT | 0 refills | Status: DC | PRN

## 2018-07-27 NOTE — Discharge Instructions (Addendum)
Use albuterol inhaler as directed.  Take Flonase as directed.  You can take Tylenol or Ibuprofen as directed for pain. You can alternate Tylenol and Ibuprofen every 4 hours. If you take Tylenol at 1pm, then you can take Ibuprofen at 5pm. Then you can take Tylenol again at 9pm.   Make sure you are drinking plenty of fluids and staying hydrated.  Return to emergency department for any fever despite medications, vomiting, difficulty breathing, chest pain or any other worsening or concerning symptoms.  Follow[facility]up with [facility] to establish a primary care doctor if you do not have one.

## 2018-07-27 NOTE — ED Triage Notes (Signed)
Patient c/o cough with bloody muscous that started this morning with tightness in chest. Patient has been congested since last Sunday.   A/Ox4.

## 2018-07-27 NOTE — ED Provider Notes (Signed)
[facility]-EMERGENCY DEPT Provider Note   CSN: [id number] Arrival date & time: 07/27/18  1257     History   Chief Complaint Chief Complaint  Patient presents with  . Cough  . Nasal Congestion    HPI Colleen Webb is a 22 y.o. female with past medical history of PCOS who presents for evaluation of 1 week of nasal congestion, rhinorrhea, cough.  Patient reports that on onset of symptoms, she had measured fever of 101.0.  She states she has not had fever in several days.  She states that she still continues to have nasal congestion, cough.  She reports cough is productive of phlegm with blood-tinged mucus.  No gross hemoptysis noted.  Patient states she has some chest tightness associated when she coughs.  No chest tightness or pain at rest.  No difficulty breathing.  She reports she has taken over-the-counter Vicks VapoRub, nasal spray with minimal improvement in symptoms.  She has not tried any other medications.  Patient denies any history of asthma.  She is not a current smoker or fever.  Patient also reports she has had some mild sore throat but she thinks it is related to coughing so much.  She has been able to tolerate her secretions and p.o. without any difficulty.  Patient denies any nausea/vomiting, difficulty breathing.  The history is provided by the patient.    Past Medical History:  Diagnosis Date  . PCOS (polycystic ovarian syndrome)     Patient Active Problem List   Diagnosis Date Noted  . PCOS (polycystic ovarian syndrome) 03/08/2016  . Amenorrhea 03/08/2016    History reviewed. No pertinent surgical history.   OB History    Gravida  0   Para  0   Term  0   Preterm  0   AB  0   Living  0     SAB  0   TAB  0   Ectopic  0   Multiple  0   Live Births               Home Medications    Prior to Admission medications   Medication Sig Start Date End Date Taking? Authorizing Provider  albuterol (PROVENTIL HFA;VENTOLIN  HFA) 108 (90 Base) MCG/ACT inhaler Inhale 1-2 puffs into the lungs every 6 (six) hours as needed for wheezing or shortness of breath. 07/27/18   Maxwell CaulLayden,  A, PA-C  fluticasone (FLONASE) 50 MCG/ACT nasal spray Place 1 spray into both nostrils daily. 07/27/18   Maxwell CaulLayden,  A, PA-C  medroxyPROGESTERone (PROVERA) 10 MG tablet Take 1 tablet (10 mg total) by mouth daily. Use for ten days 03/08/16   Anyanwu, Jethro BastosUgonna A, MD  metFORMIN (GLUCOPHAGE) 1000 MG tablet Take one tablet by mouth daily for one week. Then increase to one tablet twice a day. 03/08/16   Tereso NewcomerAnyanwu, Ugonna A, MD    Family History Family History  Problem Relation Age of Onset  . Diabetes Maternal Grandfather   . Diabetes Paternal Grandfather     Social History Social History   Tobacco Use  . Smoking status: Never Smoker  . Smokeless tobacco: Never Used  Substance Use Topics  . Alcohol use: No    Alcohol/week: 0.0 standard drinks  . Drug use: No     Allergies   Patient has no known allergies.   Review of Systems Review of Systems  Constitutional: Negative for fever.  HENT: Positive for congestion, rhinorrhea and sore throat. Negative for trouble swallowing.  Respiratory: Positive for cough and chest tightness. Negative for shortness of breath.   Cardiovascular: Negative for chest pain.  Gastrointestinal: Positive for abdominal pain. Negative for nausea and vomiting.  All other systems reviewed and are negative.    Physical Exam Updated Vital Signs BP (!) 138/92   Pulse 99   Temp 98.2 F (36.8 C) (Oral)   Resp 18   LMP 05/23/2018 (Within Weeks)   SpO2 97%   Physical Exam  Constitutional: She appears well-developed and well-nourished.  HENT:  Head: Normocephalic and atraumatic.  Nose: Mucosal edema present.  Mouth/Throat: Uvula is midline. No trismus in the jaw. Posterior oropharyngeal erythema present.  Edematous and erythematous nasal turbinates bilaterally.  Airways patent, phonation is intact.   Posterior oropharynx with slight erythema.  No edema, tonsillar exudates.  Uvula is midline.  No trismus.  Eyes: Conjunctivae and EOM are normal. Right eye exhibits no discharge. Left eye exhibits no discharge. No scleral icterus.  Pulmonary/Chest: Effort normal and breath sounds normal.  Able to speak in full sentences without any difficulty.  No evidence of respiratory distress.  Lungs clear to auscultation bilaterally.  No evidence of wheezing.  Neurological: She is alert.  Skin: Skin is warm and dry.  Psychiatric: She has a normal mood and affect. Her speech is normal and behavior is normal.  Nursing note and vitals reviewed.    ED Treatments / Results  Labs (all labs ordered are listed, but only abnormal results are displayed) Labs Reviewed - No data to display  EKG None  Radiology Dg Chest 2 View  Result Date: 07/27/2018 CLINICAL DATA:  Cough, chest tightness EXAM: CHEST - 2 VIEW COMPARISON:  None. FINDINGS: The heart size and mediastinal contours are within normal limits. Both lungs are clear. The visualized skeletal structures are unremarkable. IMPRESSION: No active cardiopulmonary disease. Electronically Signed   By: Judie Petit.  Shick M.D.   On: 07/27/2018 14:12    Procedures Procedures (including critical care time)  Medications Ordered in ED Medications - No data to display   Initial Impression / Assessment and Plan / ED Course  I have reviewed the triage vital signs and the nursing notes.  Pertinent labs & imaging results that were available during my care of the patient were reviewed by me and considered in my medical decision making (see chart for details).     22 year old female who presents for evaluation of 1 week of nasal congestion, rhinorrhea, cough.  Also reports some associated chest soreness.  She states that initially she had fever but that that improved.  Continues to have cough, prompting ED visit. Patient is afebrile, non-toxic appearing, sitting comfortably  on examination table. Vital signs reviewed and stable.  Consider URI versus bronchitis.  Low suspicion for pneumonia but given fever and cough, will plan for x-ray.  Also consider flu though lower suspicion.  History/physical exam is not concerning for ACS etiology, PE.  X-ray reviewed.  No evidence of acute infectious etiology.  I discussed with patient that this is most likely bronchitis.  I discussed with her that it could have been the flu given her fever but that there would be no acute intervention needed since she is past the 48-hour window of treatment.  We will plan for supportive at home therapies.  Encourage primary care follow-up. Patient had ample opportunity for questions and discussion. All patient's questions were answered with full understanding. Strict return precautions discussed. Patient expresses understanding and agreement to plan.   Final Clinical Impressions(s) / ED  Diagnoses   Final diagnoses:  Upper respiratory tract infection, unspecified type    ED Discharge Orders         Ordered    albuterol (PROVENTIL HFA;VENTOLIN HFA) 108 (90 Base) MCG/ACT inhaler  Every 6 hours PRN     07/27/18 1425    fluticasone (FLONASE) 50 MCG/ACT nasal spray  Daily     07/27/18 1425           Maxwell Caul, PA-C 07/27/18 1430    Maia Plan, MD 07/28/18 [phone]

## 2018-11-26 ENCOUNTER — Ambulatory Visit: Payer: BLUE CROSS/BLUE SHIELD | Admitting: Family Medicine

## 2018-12-17 ENCOUNTER — Telehealth: Payer: Self-pay

## 2018-12-17 NOTE — Telephone Encounter (Signed)
LMVM to make a new patient appointment. The patient does have an appointment at [facility] in June but with her symptoms we are seeing new patients virtually as soon as today.

## 2019-01-02 ENCOUNTER — Other Ambulatory Visit: Payer: Self-pay

## 2019-01-02 ENCOUNTER — Ambulatory Visit (INDEPENDENT_AMBULATORY_CARE_PROVIDER_SITE_OTHER): Payer: Self-pay | Admitting: Family Medicine

## 2019-01-02 ENCOUNTER — Encounter: Payer: Self-pay | Admitting: Family Medicine

## 2019-01-02 DIAGNOSIS — R5383 Other fatigue: Secondary | ICD-10-CM

## 2019-01-02 DIAGNOSIS — N912 Amenorrhea, unspecified: Secondary | ICD-10-CM

## 2019-01-02 DIAGNOSIS — Z131 Encounter for screening for diabetes mellitus: Secondary | ICD-10-CM

## 2019-01-02 DIAGNOSIS — Z13220 Encounter for screening for lipoid disorders: Secondary | ICD-10-CM

## 2019-01-02 DIAGNOSIS — R600 Localized edema: Secondary | ICD-10-CM

## 2019-01-02 DIAGNOSIS — G4730 Sleep apnea, unspecified: Secondary | ICD-10-CM

## 2019-01-02 DIAGNOSIS — E282 Polycystic ovarian syndrome: Secondary | ICD-10-CM

## 2019-01-02 MED ORDER — FUROSEMIDE 20 MG PO TABS: 20.0000 mg | ORAL_TABLET | Freq: Every day | ORAL | 0 refills | Status: DC | PRN

## 2019-01-02 MED ORDER — LEVONORGESTREL-ETHINYL ESTRAD 0.1-20 MG-MCG PO TABS: 1.0000 | ORAL_TABLET | Freq: Every day | ORAL | 11 refills | Status: DC

## 2019-01-02 NOTE — Progress Notes (Signed)
Virtual Visit via Video Note  I connected with Colleen Webb   on 01/02/19 at  2:00 PM EDT by a video enabled telemedicine application and verified that I am speaking with the correct person using two identifiers.  Location patient: home Location provider:work office Persons participating in the virtual visit: patient, provider  I discussed the limitations of evaluation and management by telemedicine and the availability of in person appointments. The patient expressed understanding and agreed to proceed.   Colleen Webb DOB: Aug 30, 1995 Encounter date: 01/02/2019  This is a 23 y.o. female who presents to establish care. Chief Complaint  Patient presents with  . Establish Care  . Leg Swelling    bilateral lower extremities x3 months, states previous physician gave fluid pills due to this problem at age 45  . Nausea    and reflux x3-4 months    History of present illness: Hasn't had regular provider for a couple of years.   Leg swelling: thinks just some fluid build up. States that mom has the same thing. She drinks about 7-8 waters/day and then notes that there is "dent" in legs with pressing.   Nausea: has been going on for a few months. If she eats something that doesn't agree with her or anything acidic (like orange juice) she has nausea. Usually just waits for stomach to settle and it usually passes. Sometimes uses pepto bismol but doesn't like taste so doesn't take often. Usually better by following day. Sometimes will get sour burps/heartburn but usually triggered by something specific. Doesn't vomit. Does have regular bowel movements.   Has irregular periods. Last period was September 2019. Has dx of PCOS. Was diagnosed with this Was dx with PCOS around age 89-16. Periods were always irregular. She had [place] at that time for diagnosis. Was put on metformin at that time. Also started birth control in 2019 which did help her have regular cycles. Stopped taking it because she was  forgetting to take it sometimes.   Has had difficulty with weight gain in past. Rollercoaster. Gets frustrated with this and regaining. Does try to exercise but lately hasn't been exercising.   Denies elevation of blood pressure in past.   Has had sleep studies done in past. Second study resulted in her getting tonsils out, but sx came back. Thinks this was around 2015.   States that she did have trial of some fluid pill in past (2014); doesn't remember name of medication. Did feel like it helped. This was a short 14 day trial.    Past Medical History:  Diagnosis Date  . PCOS (polycystic ovarian syndrome)    Past Surgical History:  Procedure Laterality Date  . TONSILLECTOMY     No Known Allergies Current Meds  Medication Sig  . albuterol (PROVENTIL HFA;VENTOLIN HFA) 108 (90 Base) MCG/ACT inhaler Inhale 1-2 puffs into the lungs every 6 (six) hours as needed for wheezing or shortness of breath.  . fluticasone (FLONASE) 50 MCG/ACT nasal spray Place 1 spray into both nostrils daily.   Social History   Tobacco Use  . Smoking status: Never Smoker  . Smokeless tobacco: Never Used  Substance Use Topics  . Alcohol use: Yes    Alcohol/week: 0.0 standard drinks    Comment: occasional   Family History  Problem Relation Age of Onset  . Diabetes Maternal Grandfather   . Diabetes Paternal Grandfather   . Diabetes Brother   . Diabetes Maternal Grandmother   . Healthy Brother   . Healthy Brother  Review of Systems  Constitutional: Negative for chills, fatigue, fever and unexpected weight change (weight fluctuates).  Respiratory: Negative for cough, chest tightness, shortness of breath and wheezing.   Cardiovascular: Negative for chest pain, palpitations and leg swelling.  Gastrointestinal: Positive for abdominal pain (come when nausea is bad) and nausea. Negative for anal bleeding, blood in stool, constipation, diarrhea and vomiting.  Psychiatric/Behavioral: Positive for sleep  disturbance (does snore; has been told she stops breathing when she is sleeping. ).    Objective:  There were no vitals taken for this visit.      BP Readings from Last 3 Encounters:  07/27/18 (!) 138/92  10/17/16 109/63  03/08/16 109/68   Wt Readings from Last 3 Encounters:  10/17/16 291 lb (132 kg)  03/08/16 297 lb (134.7 kg)  11/22/15 298 lb 5 oz (135.3 kg)    EXAM:  GENERAL: alert, oriented, appears well and in no acute distress  HEENT: atraumatic, conjunctiva clear, no obvious abnormalities on inspection of external nose and ears  NECK: normal movements of the head and neck  LUNGS: on inspection no signs of respiratory distress, breathing rate appears normal, no obvious gross SOB, gasping or wheezing  CV: no obvious cyanosis; she does have some visible trace-1+ edema bilat LE  MS: moves all visible extremities without noticeable abnormality  PSYCH/NEURO: pleasant and cooperative, no obvious depression or anxiety, speech and thought processing grossly intact  SKIN: no obvious skin abnormalities/rashes  Assessment/Plan  1. Sleep apnea, unspecified type Due for repeat study; still having snoring, apneas s/p tonsillectomy - Ambulatory referral to Sleep Studies  2. PCOS (polycystic ovarian syndrome) Interested in resuming treatment for control of period/regulation. She tolerating ocp and metformin in past; I will send these in pending her bloodwork results.  - levonorgestrel-ethinyl estradiol (AVIANE) 0.1-20 MG-MCG tablet; Take 1 tablet by mouth daily.  Dispense: 1 Package; Refill: 11  3. Amenorrhea Typical for her to go months without period, but we will ensure she is not pregnant before starting ocp. She has not been sexually active in a couple of months.  - HCG, Qualitative  4. Lipid screening - Lipid panel; Future  5. Screening for diabetes mellitus - Hemoglobin A1c; Future  6. Bilateral lower extremity edema Prn lasix use. Discussed new medication(s)  today with patient. Discussed potential side effects and patient verbalized understanding.  - furosemide (LASIX) 20 MG tablet; Take 1 tablet (20 mg total) by mouth daily as needed for edema.  Dispense: 30 tablet; Refill: 0 - CBC with Differential/Platelet; Future - Comprehensive metabolic panel; Future  7. Fatigue, unspecified type - TSH; Future    Return bloodwork soon, follow up pending results.   I discussed the assessment and treatment plan with the patient. The patient was provided an opportunity to ask questions and all were answered. The patient agreed with the plan and demonstrated an understanding of the instructions.   The patient was advised to call back or seek an in-person evaluation if the symptoms worsen or if the condition fails to improve as anticipated.  I provided 32 minutes of non-face-to-face time during this encounter.   Theodis ShoveJunell Ilan Kahrs, MD

## 2019-01-05 ENCOUNTER — Telehealth: Payer: Self-pay | Admitting: *Deleted

## 2019-01-05 NOTE — Telephone Encounter (Signed)
I left a detailed message for the pt to call the office to schedule a lab appt and follow up visit.

## 2019-01-05 NOTE — Telephone Encounter (Signed)
-----   Message from Wynn Banker, MD sent at 01/02/2019  2:33 PM EDT ----- Please schedule for bloodwork when able; follow up visit in 1 months time.

## 2019-01-23 ENCOUNTER — Other Ambulatory Visit: Payer: Medicaid Other

## 2019-01-29 ENCOUNTER — Other Ambulatory Visit: Payer: Self-pay

## 2019-01-29 ENCOUNTER — Other Ambulatory Visit (INDEPENDENT_AMBULATORY_CARE_PROVIDER_SITE_OTHER): Payer: Self-pay

## 2019-01-29 DIAGNOSIS — Z13220 Encounter for screening for lipoid disorders: Secondary | ICD-10-CM

## 2019-01-29 DIAGNOSIS — Z131 Encounter for screening for diabetes mellitus: Secondary | ICD-10-CM

## 2019-01-29 DIAGNOSIS — R600 Localized edema: Secondary | ICD-10-CM

## 2019-01-29 DIAGNOSIS — R5383 Other fatigue: Secondary | ICD-10-CM

## 2019-01-29 LAB — COMPREHENSIVE METABOLIC PANEL
ALT: 37 U/L — ABNORMAL HIGH (ref 0–35)
AST: 24 U/L (ref 0–37)
Albumin: 3.6 g/dL (ref 3.5–5.2)
Alkaline Phosphatase: 91 U/L (ref 39–117)
BUN: 17 mg/dL (ref 6–23)
CO2: 30 mEq/L (ref 19–32)
Calcium: 9 mg/dL (ref 8.4–10.5)
Chloride: 104 mEq/L (ref 96–112)
Creatinine, Ser: 0.58 mg/dL (ref 0.40–1.20)
GFR: 155.3 mL/min (ref >60.00)
Glucose, Bld: 89 mg/dL (ref 70–99)
Potassium: 4 mEq/L (ref 3.5–5.1)
Sodium: 140 mEq/L (ref 135–145)
Total Bilirubin: 0.2 mg/dL (ref 0.2–1.2)
Total Protein: 7 g/dL (ref 6.0–8.3)

## 2019-01-29 LAB — CBC WITH DIFFERENTIAL/PLATELET
Basophils Absolute: 0 10*3/uL (ref 0.0–0.1)
Basophils Relative: 0.5 % (ref 0.0–3.0)
Eosinophils Absolute: 0.2 10*3/uL (ref 0.0–0.7)
Eosinophils Relative: 2.6 % (ref 0.0–5.0)
HCT: 35.3 % — ABNORMAL LOW (ref 36.0–46.0)
Hemoglobin: 11.9 g/dL — ABNORMAL LOW (ref 12.0–15.0)
Lymphocytes Relative: 32.9 % (ref 12.0–46.0)
Lymphs Abs: 1.9 10*3/uL (ref 0.7–4.0)
MCHC: 33.8 g/dL (ref 30.0–36.0)
MCV: 82.2 fl (ref 78.0–100.0)
Monocytes Absolute: 0.4 10*3/uL (ref 0.1–1.0)
Monocytes Relative: 7.1 % (ref 3.0–12.0)
Neutro Abs: 3.3 10*3/uL (ref 1.4–7.7)
Neutrophils Relative %: 56.9 % (ref 43.0–77.0)
Platelets: 291 10*3/uL (ref 150.0–400.0)
RBC: 4.29 Mil/uL (ref 3.87–5.11)
RDW: 14.6 % (ref 11.5–15.5)
WBC: 5.9 10*3/uL (ref 4.0–10.5)

## 2019-01-29 LAB — LIPID PANEL
Cholesterol: 149 mg/dL (ref 0–200)
HDL: 52.3 mg/dL (ref >39.00)
LDL Cholesterol: 87 mg/dL (ref 0–99)
NonHDL: 97.13
Total CHOL/HDL Ratio: 3
Triglycerides: 50 mg/dL (ref 0.0–149.0)
VLDL: 10 mg/dL (ref 0.0–40.0)

## 2019-01-29 LAB — TSH: TSH: 3.26 u[IU]/mL (ref 0.35–4.50)

## 2019-01-29 LAB — HEMOGLOBIN A1C: Hgb A1c MFr Bld: 6 % (ref 4.6–6.5)

## 2019-01-30 LAB — HCG, SERUM, QUALITATIVE: Preg, Serum: NEGATIVE

## 2019-02-02 ENCOUNTER — Ambulatory Visit (INDEPENDENT_AMBULATORY_CARE_PROVIDER_SITE_OTHER): Payer: Medicaid Other | Admitting: Family Medicine

## 2019-02-02 ENCOUNTER — Other Ambulatory Visit: Payer: Self-pay

## 2019-02-02 ENCOUNTER — Encounter: Payer: Self-pay | Admitting: Family Medicine

## 2019-02-02 DIAGNOSIS — K219 Gastro-esophageal reflux disease without esophagitis: Secondary | ICD-10-CM

## 2019-02-02 DIAGNOSIS — E282 Polycystic ovarian syndrome: Secondary | ICD-10-CM

## 2019-02-02 DIAGNOSIS — R600 Localized edema: Secondary | ICD-10-CM

## 2019-02-02 DIAGNOSIS — G4730 Sleep apnea, unspecified: Secondary | ICD-10-CM

## 2019-02-02 MED ORDER — LEVONORGESTREL-ETHINYL ESTRAD 0.1-20 MG-MCG PO TABS: 1.0000 | ORAL_TABLET | Freq: Every day | ORAL | 1 refills | Status: DC

## 2019-02-02 MED ORDER — METFORMIN HCL 500 MG PO TABS: 500.0000 mg | ORAL_TABLET | Freq: Two times a day (BID) | ORAL | 1 refills | Status: DC

## 2019-02-02 MED ORDER — FUROSEMIDE 20 MG PO TABS: 20.0000 mg | ORAL_TABLET | Freq: Every day | ORAL | 1 refills | Status: DC | PRN

## 2019-02-02 NOTE — Progress Notes (Signed)
Virtual Visit via Video Note  I connected with Colleen Webb  on 02/02/19 at  8:00 AM EDT by a video enabled telemedicine application and verified that I am speaking with the correct person using two identifiers.  Location patient: home Location provider:work or home office Persons participating in the virtual visit: patient, provider  I discussed the limitations of evaluation and management by telemedicine and the availability of in person appointments. The patient expressed understanding and agreed to proceed.   Colleen Webb DOB: [birth year] Encounter date: 02/02/2019  This is a 23 y.o. female who presents with Chief Complaint  Patient presents with  . Follow-up   Last visit 5/8 was establish care. We referred for sleep study, started ocp. Was ok'd to start metformin 500mg  BID after bloodwork review.   Lasix written for prn use for edema LE. Discussed nausea. Discussed frustration with inability to lose weight.   History of present illness:  Doing pretty well overall. Cycle still has not started.  Swelling in legs seems to be more persistent, but thinks this is related to job. Job required sitting all day.   Trying to walk on regular basis. Gym still not open. Eating ok - some days just once.   Still having some nausea - has improved with limiting dairy and acidic foods. Notes with foods (like sauces with lemon) or citrus makes stomach days worse. Does sometimes get cramping in abdomen with these symptoms. Usually goes away in 1-2 days. Usually just after a "bad" day. Burping, cramps.   No Known Allergies Current Meds  Medication Sig  . albuterol (PROVENTIL HFA;VENTOLIN HFA) 108 (90 Base) MCG/ACT inhaler Inhale 1-2 puffs into the lungs every 6 (six) hours as needed for wheezing or shortness of breath.  . fluticasone (FLONASE) 50 MCG/ACT nasal spray Place 1 spray into both nostrils daily.  . [DISCONTINUED] furosemide (LASIX) 20 MG tablet Take 1 tablet (20 mg total) by mouth  daily as needed for edema.  . [DISCONTINUED] levonorgestrel-ethinyl estradiol (AVIANE) 0.1-20 MG-MCG tablet Take 1 tablet by mouth daily.    Review of Systems  Constitutional: Negative for chills, fatigue and fever.  Respiratory: Negative for cough, chest tightness, shortness of breath and wheezing.   Cardiovascular: Positive for leg swelling (has been stable; worse at work with sitting). Negative for chest pain and palpitations.  Gastrointestinal: Positive for abdominal pain (with eating food triggers) and nausea (when eating dairy,citrus). Negative for constipation and diarrhea.    Objective:  There were no vitals taken for this visit.      BP Readings from Last 3 Encounters:  07/27/18 (!) 138/92  10/17/16 109/63  03/08/16 109/68   Wt Readings from Last 3 Encounters:  10/17/16 291 lb (132 kg)  03/08/16 297 lb (134.7 kg)  11/22/15 298 lb 5 oz (135.3 kg)    EXAM:  GENERAL: alert, oriented, appears well and in no acute distress  HEENT: atraumatic, conjunctiva clear, no obvious abnormalities on inspection of external nose and ears  NECK: normal movements of the head and neck  LUNGS: on inspection no signs of respiratory distress, breathing rate appears normal, no obvious gross SOB, gasping or wheezing  CV: no obvious cyanosis  MS: moves all visible extremities without noticeable abnormality  PSYCH/NEURO: pleasant and cooperative, no obvious depression or anxiety, speech and thought processing grossly intact  Assessment/Plan  1. Sleep apnea, unspecified type Number given for [place] [facility] since she had not heard from them.   2. PCOS (polycystic ovarian syndrome) Restart ocp, start metformin.  Work on [employer] eating and regular exercise. Discussed metabolic syndrome and elevated blood glucose.   3. Bilateral lower extremity edema Lasix prn. Elevate legs when sitting. Monitor sodium intake.   4. GERD: avoid trigger foods. OK to use prilosec OTC if eating meal that cannot  be avoided that will trigger.     Return in about 3 months (around 05/05/2019) for physical exam.    I discussed the assessment and treatment plan with the patient. The patient was provided an opportunity to ask questions and all were answered. The patient agreed with the plan and demonstrated an understanding of the instructions.   The patient was advised to call back or seek an in-person evaluation if the symptoms worsen or if the condition fails to improve as anticipated.  I provided 25 minutes of non-face-to-face time during this encounter.   Theodis ShoveJunell Geanie Pacifico, MD

## 2019-02-17 ENCOUNTER — Ambulatory Visit (INDEPENDENT_AMBULATORY_CARE_PROVIDER_SITE_OTHER): Payer: BC Managed Care – PPO | Admitting: Internal Medicine

## 2019-02-17 ENCOUNTER — Other Ambulatory Visit: Payer: Self-pay

## 2019-02-17 ENCOUNTER — Encounter: Payer: Self-pay | Admitting: Internal Medicine

## 2019-02-17 VITALS — BP 110/70 | Temp 98.3°F | Wt 326.6 lb

## 2019-02-17 DIAGNOSIS — K625 Hemorrhage of anus and rectum: Secondary | ICD-10-CM | POA: Diagnosis not present

## 2019-02-17 MED ORDER — PANTOPRAZOLE SODIUM 40 MG PO TBEC: 40.0000 mg | DELAYED_RELEASE_TABLET | Freq: Every day | ORAL | 2 refills | Status: DC

## 2019-02-17 NOTE — Patient Instructions (Signed)
-  Nice meeting you today!!  -Start taking protonix 40 mg daily.  -Come back if bleeding persists after 7-10 days.

## 2019-02-17 NOTE — Progress Notes (Signed)
Established Patient Office Visit     CC/Reason for Visit: rectal bleeding  HPI: Colleen Webb is a 23 y.o. female who is coming in today for the above mentioned reasons. Yesterday she had 4 BMs. 3 times she noticed some red blood mixed with dark clots. She isn't quite sure if she has started her cycle yet, but doesn't think so as she has not noted soiling of her underwear or need to wear a pad. She is not constipated, does not feel stressed, does not use OTC NSAIDs routinely, maybe had a couple ibuprofen about 4-6 weeks ago. She saw Dr. Ethlyn Gallery earlier this month with dyspepsia-type symptoms and was asked to use PRN prilosec which she hasn't started yet. No hematemesis or abdominal pain.  Past Medical/Surgical History: Past Medical History:  Diagnosis Date  . PCOS (polycystic ovarian syndrome)     Past Surgical History:  Procedure Laterality Date  . TONSILLECTOMY      Social History:  reports that she has never smoked. She has never used smokeless tobacco. She reports current alcohol use. She reports that she does not use drugs.  Allergies: No Known Allergies  Family History:  Family History  Problem Relation Age of Onset  . Diabetes Maternal Grandfather   . Diabetes Paternal Grandfather   . Diabetes Brother   . Diabetes Maternal Grandmother   . Healthy Brother   . Healthy Brother      Current Outpatient Medications:  .  albuterol (PROVENTIL HFA;VENTOLIN HFA) 108 (90 Base) MCG/ACT inhaler, Inhale 1-2 puffs into the lungs every 6 (six) hours as needed for wheezing or shortness of breath., Disp: 1 Inhaler, Rfl: 0 .  fluticasone (FLONASE) 50 MCG/ACT nasal spray, Place 1 spray into both nostrils daily., Disp: 16 g, Rfl: 2 .  furosemide (LASIX) 20 MG tablet, Take 1 tablet (20 mg total) by mouth daily as needed for edema., Disp: 90 tablet, Rfl: 1 .  levonorgestrel-ethinyl estradiol (AVIANE) 0.1-20 MG-MCG tablet, Take 1 tablet by mouth daily., Disp: 3 Package, Rfl: 1  .  metFORMIN (GLUCOPHAGE) 500 MG tablet, Take 1 tablet (500 mg total) by mouth 2 (two) times daily with a meal., Disp: 180 tablet, Rfl: 1 .  pantoprazole (PROTONIX) 40 MG tablet, Take 1 tablet (40 mg total) by mouth daily., Disp: 30 tablet, Rfl: 2  Review of Systems:  Constitutional: Denies fever, chills, diaphoresis, appetite change and fatigue.  HEENT: Denies photophobia, eye pain, redness, hearing loss, ear pain, congestion, sore throat, rhinorrhea, sneezing, mouth sores, trouble swallowing, neck pain, neck stiffness and tinnitus.   Respiratory: Denies SOB, DOE, cough, chest tightness,  and wheezing.   Cardiovascular: Denies chest pain, palpitations and leg swelling.  Gastrointestinal: Denies  vomiting,  diarrhea, constipation and abdominal distention.  Genitourinary: Denies dysuria, urgency, frequency, hematuria, flank pain and difficulty urinating.  Endocrine: Denies: hot or cold intolerance, sweats, changes in hair or nails, polyuria, polydipsia. Musculoskeletal: Denies myalgias, back pain, joint swelling, arthralgias and gait problem.  Skin: Denies pallor, rash and wound.  Neurological: Denies dizziness, seizures, syncope, weakness, light-headedness, numbness and headaches.  Hematological: Denies adenopathy. Easy bruising, personal or family bleeding history  Psychiatric/Behavioral: Denies suicidal ideation, mood changes, confusion, nervousness, sleep disturbance and agitation    Physical Exam: Vitals:   02/17/19 1041  BP: 110/70  Temp: 98.3 F (36.8 C)  TempSrc: Oral  Weight: (!) 326 lb 9.6 oz (148.1 kg)    Body mass index is 61.71 kg/m.   Constitutional: NAD, calm, comfortable, obese Eyes:  PERRL, lids and conjunctivae normal ENMT: Mucous membranes are moist.  Respiratory: clear to auscultation bilaterally, no wheezing, no crackles. Normal respiratory effort. No accessory muscle use.  Cardiovascular: Regular rate and rhythm, no murmurs / rubs / gallops. No extremity  edema. 2+ pedal pulses. No carotid bruits.  Abdomen: no tenderness, no masses palpated. No hepatosplenomegaly. Bowel sounds positive.  Musculoskeletal: no clubbing / cyanosis. No joint deformity upper and lower extremities. Good ROM, no contractures. Normal muscle tone.  Psychiatric: Normal judgment and insight. Alert and oriented x 3. Normal mood.    Impression and Plan:  Rectal bleeding  -Seems benign. -Advised daily PPI use instead of PRN. -See no need for further work up now, but she is advised to contact us if she continues to have these episodes. -Asked to refrain from all NSAID use.     Patient Instructions  -Nice meeting you today!!  -Start taking protonix 40 mg daily.  -Come back if bleeding persists after 7-10 days.     Chaya JanEstela Hernandez Acosta, MD [facility]

## 2019-02-20 ENCOUNTER — Ambulatory Visit: Payer: BLUE CROSS/BLUE SHIELD | Admitting: Family

## 2019-03-17 ENCOUNTER — Other Ambulatory Visit: Payer: Self-pay | Admitting: Internal Medicine

## 2019-03-17 DIAGNOSIS — K625 Hemorrhage of anus and rectum: Secondary | ICD-10-CM

## 2019-04-01 ENCOUNTER — Institutional Professional Consult (permissible substitution): Payer: BC Managed Care – PPO | Admitting: Pulmonary Disease

## 2019-04-15 ENCOUNTER — Institutional Professional Consult (permissible substitution): Payer: BC Managed Care – PPO | Admitting: Pulmonary Disease

## 2019-05-07 NOTE — Progress Notes (Deleted)
Colleen Webb DOB: 1996/05/09 Encounter date: 05/08/2019  This is a 23 y.o. female who presents for complete physical   History of present illness/Additional concerns:  ***  Past Medical History:  Diagnosis Date  . PCOS (polycystic ovarian syndrome)    Past Surgical History:  Procedure Laterality Date  . TONSILLECTOMY     No Known Allergies No outpatient medications have been marked as taking for the 05/08/19 encounter (Appointment) with Caren Macadam, MD.   Social History   Tobacco Use  . Smoking status: Never Smoker  . Smokeless tobacco: Never Used  Substance Use Topics  . Alcohol use: Yes    Alcohol/week: 0.0 standard drinks    Comment: occasional   Family History  Problem Relation Age of Onset  . Diabetes Maternal Grandfather   . Diabetes Paternal Grandfather   . Diabetes Brother   . Diabetes Maternal Grandmother   . Healthy Brother   . Healthy Brother      Review of Systems  CBC:  Lab Results  Component Value Date   WBC 5.9 01/29/2019   HGB 11.9 (L) 01/29/2019   HCT 35.3 (L) 01/29/2019   MCHC 33.8 01/29/2019   RDW 14.6 01/29/2019   PLT 291.0 01/29/2019   CMP: Lab Results  Component Value Date   NA 140 01/29/2019   K 4.0 01/29/2019   CL 104 01/29/2019   CO2 30 01/29/2019   GLUCOSE 89 01/29/2019   BUN 17 01/29/2019   CREATININE 0.58 01/29/2019   CALCIUM 9.0 01/29/2019   PROT 7.0 01/29/2019   BILITOT 0.2 01/29/2019   ALKPHOS 91 01/29/2019   ALT 37 (H) 01/29/2019   AST 24 01/29/2019   LIPID: Lab Results  Component Value Date   CHOL 149 01/29/2019   TRIG 50.0 01/29/2019   HDL 52.30 01/29/2019   LDLCALC 87 01/29/2019    Objective:  There were no vitals taken for this visit.      BP Readings from Last 3 Encounters:  02/17/19 110/70  07/27/18 (!) 138/92  10/17/16 109/63   Wt Readings from Last 3 Encounters:  02/17/19 (!) 326 lb 9.6 oz (148.1 kg)  10/17/16 291 lb (132 kg)  03/08/16 297 lb (134.7 kg)    Physical  Exam  Assessment/Plan: Health Maintenance Due  Topic Date Due  . HIV Screening  08/25/2010  . TETANUS/TDAP  08/25/2014  . PAP-Cervical Cytology Screening  08/25/2016  . PAP SMEAR-Modifier  08/25/2016  . INFLUENZA VACCINE  03/28/2019   Health Maintenance reviewed - {health maintenance:315237}.  There are no diagnoses linked to this encounter.  No follow-ups on file.  Micheline Rough, MD

## 2019-05-08 ENCOUNTER — Encounter: Payer: Medicaid Other | Admitting: Family Medicine

## 2019-07-20 ENCOUNTER — Ambulatory Visit: Payer: Self-pay | Admitting: Family Medicine

## 2019-07-20 NOTE — Telephone Encounter (Signed)
Pt seen in June for similar symptoms; rectal bleeding. States put on a trial of med for 10 days which helped, has now been reoccurred  1-2 months ago.   Reports bright red bleeding, mixed in stools and now wearing a pad, changing twice a day, moderate. Denies any dizziness, no abdominal pain, no nausea, vomiting. Does reports occasional cramping right before BM. Denies constipation and/or diarrhea.States afebrile. No rectal pain. States has noted an occasional "String like a clot." Also reports left hand ring and pinkie finger with loss of sensation "Like numb" past week, constant.  After hours call. Assured [place] would route to practice for Dr. Berenice Bouton review.  Advised ED if symptoms worsen; increased bleeding, dizziness, abdominal pain.  Verbalizes understanding.   102Reason for Disposition . Rectal bleeding is a chronic symptom (recurrent or ongoing AND present > 4 weeks)  Answer Assessment - Initial Assessment Questions 1. APPEARANCE of BLOOD: "What color is it?" "Is it passed separately, on the surface of the stool, or mixed in with the stool?"     Bright red,in stool, with wiping, mixed in stoll 2. AMOUNT: "How much blood was passed?"      Changes pad twice a day, moderate 3. FREQUENCY: "How many times has blood been passed with the stools?"      *No Answer* 4. ONSET: "When was the blood first seen in the stools?" (Days or weeks)      June 5. DIARRHEA: "Is there also some diarrhea?" If so, ask: "How many diarrhea stools were passed in past 24 hours?"      no 6. CONSTIPATION: "Do you have constipation?" If so, "How bad is it?"     no 7. RECURRENT SYMPTOMS: "Have you had blood in your stools before?" If so, ask: "When was the last time?" and "What happened that time?"      Yes in June 8. BLOOD THINNERS: "Do you take any blood thinners?" (e.g., Coumadin/warfarin, Pradaxa/dabigatran, aspirin)     no 9. OTHER SYMPTOMS: "Do you have any other symptoms?"  (e.g., abdominal pain, vomiting,  dizziness, fever)    Cramping at times before having BM  Protocols used: RECTAL BLEEDING-A-AH

## 2019-07-21 ENCOUNTER — Encounter: Payer: Self-pay | Admitting: Family Medicine

## 2019-07-21 ENCOUNTER — Other Ambulatory Visit: Payer: Self-pay

## 2019-07-21 ENCOUNTER — Ambulatory Visit (INDEPENDENT_AMBULATORY_CARE_PROVIDER_SITE_OTHER): Payer: BC Managed Care – PPO | Admitting: Family Medicine

## 2019-07-21 VITALS — BP 124/78 | HR 100 | Temp 97.8°F | Wt 326.8 lb

## 2019-07-21 DIAGNOSIS — K921 Melena: Secondary | ICD-10-CM | POA: Diagnosis not present

## 2019-07-21 DIAGNOSIS — K648 Other hemorrhoids: Secondary | ICD-10-CM

## 2019-07-21 DIAGNOSIS — G5622 Lesion of ulnar nerve, left upper limb: Secondary | ICD-10-CM

## 2019-07-21 MED ORDER — HYDROCORTISONE ACETATE 25 MG RE SUPP: 25.0000 mg | Freq: Two times a day (BID) | RECTAL | 0 refills | Status: DC

## 2019-07-21 NOTE — Telephone Encounter (Signed)
Message sent to Dr Elease Hashimoto as Dr Ethlyn Gallery is out of the office.

## 2019-07-21 NOTE — Telephone Encounter (Signed)
Patient called back and an appt was scheduled for today to arrive at 10:45am.

## 2019-07-21 NOTE — Progress Notes (Signed)
Subjective:     Patient ID: Colleen Webb, female   DOB: 1995-12-07, 23 y.o.   MRN: [id number]  HPI Patient is seen with the following concerns  Intermittent bright red blood per rectum.  She had similar episode back in June and this eventually went away.  She started having recurrent episodes several days ago.  Blood is mostly bright.  She denies any constipation.  No anal pain.  No pain with bowel movements whatsoever.  No aspirin use.  No family history of colon cancer.  No appetite or weight changes.  She notices pain with wiping.  She is fairly clear this is not coming vaginally.  No abdominal pain.    Second issue is she has had about 2 weeks now of some left hand numbness involving the fifth digit and ulnar half of the fourth.  No elbow pain.  No neck pain.  She does spend long time at a computer but takes care to keep pressure off the elbow.  She does not have any prior history of ulnar nerve issues.  No other areas of numbness.  No definite weakness.  She is right-hand dominant.  Past Medical History:  Diagnosis Date  . PCOS (polycystic ovarian syndrome)    Past Surgical History:  Procedure Laterality Date  . TONSILLECTOMY      reports that she has never smoked. She has never used smokeless tobacco. She reports current alcohol use. She reports that she does not use drugs. family history includes Diabetes in her brother, maternal grandfather, maternal grandmother, and paternal grandfather; Healthy in her brother and brother. No Known Allergies   Review of Systems  Constitutional: Negative for appetite change, chills, fever and unexpected weight change.  Respiratory: Negative for shortness of breath.   Cardiovascular: Negative for chest pain.  Gastrointestinal: Positive for blood in stool. Negative for abdominal pain, constipation, diarrhea, nausea and vomiting.  Musculoskeletal: Negative for neck pain.  Neurological: Positive for numbness. Negative for dizziness and  weakness.       Objective:   Physical Exam Vitals signs reviewed.  Constitutional:      Appearance: Normal appearance.  Neck:     Musculoskeletal: Neck supple.  Cardiovascular:     Rate and Rhythm: Normal rate and regular rhythm.  Pulmonary:     Effort: Pulmonary effort is normal.     Breath sounds: Normal breath sounds.  Genitourinary:    Comments: Rectal exam reveals no external hemorrhoids.  No visible anal fissure.  Digital exam reveals no tenderness.  No palpated masses.  Anoscopy reveals some internal hemorrhoids with mild bleeding.  No other abnormalities noted.  No anal fissures. Musculoskeletal:     Comments: Full range of motion left elbow.  No tenderness around the elbow region  Neurological:     Mental Status: She is alert.     Comments: No obvious muscle atrophy upper extremities.  She does have some mild interosseous weakness left hand compared to the right.  She has some definite impairment with monofilament testing involving the left fifth digit and ulnar half of the fourth digit.        Assessment:     #1 intermittent hematochezia-no evidence for external hemorrhoids or anal fissure on exam but she does have some internal hemorrhoids with mild bleeding at time of exam which makes this fairly likely source  #2 left ulnar neuropathy.  No clear triggering factors such as local pressure on the elbow.  No associated weakness.    Plan:     -  Discussed importance of high-fiber diet with plenty of fluids to avoid any straining.  She has no history of constipation. -Suggested trial of Anusol 25 mg suppositories twice daily for 2 weeks -Check CBC -Recommend that she touch base if she sees any more bright red blood per rectum after treatment above -Discussed options regarding her ulnar nerve issue.  We discussed either setting up EMG or neurology referral but at this point she declines.  She is almost phobic about needles and would like to try to avoid (nerve conduction  testing).  We mentioned the importance of avoiding pressure on the left elbow region.  We have suggested that she be referred for further evaluation and testing if symptoms not resolving over the next couple of weeks and she agrees  Eulas Post MD [facility]

## 2019-07-21 NOTE — Patient Instructions (Signed)

## 2019-07-21 NOTE — Telephone Encounter (Signed)
OK to evaluate

## 2019-07-21 NOTE — Telephone Encounter (Signed)
Left a message for the pt to return my call.  

## 2019-08-04 ENCOUNTER — Other Ambulatory Visit: Payer: BC Managed Care – PPO

## 2019-08-04 NOTE — Addendum Note (Signed)
Addended by: Suzette Battiest on: 08/04/2019 10:10 AM   Modules accepted: Orders

## 2019-09-16 ENCOUNTER — Other Ambulatory Visit: Payer: Self-pay

## 2019-09-16 ENCOUNTER — Telehealth: Payer: BC Managed Care – PPO | Admitting: Family Medicine

## 2019-12-11 ENCOUNTER — Ambulatory Visit (INDEPENDENT_AMBULATORY_CARE_PROVIDER_SITE_OTHER): Payer: BC Managed Care – PPO | Admitting: Family Medicine

## 2019-12-11 ENCOUNTER — Encounter: Payer: Self-pay | Admitting: Family Medicine

## 2019-12-11 ENCOUNTER — Other Ambulatory Visit: Payer: Self-pay

## 2019-12-11 ENCOUNTER — Other Ambulatory Visit (HOSPITAL_COMMUNITY)
Admission: RE | Admit: 2019-12-11 | Discharge: 2019-12-11 | Disposition: A | Discharge status: Home / Self Care | Payer: BC Managed Care – PPO | Source: Ambulatory Visit | Attending: Family Medicine | Admitting: Family Medicine

## 2019-12-11 VITALS — BP 122/70 | HR 107 | Temp 97.1°F | Ht 62.5 in | Wt 347.9 lb

## 2019-12-11 DIAGNOSIS — R87610 Atypical squamous cells of undetermined significance on cytologic smear of cervix (ASC-US): Secondary | ICD-10-CM | POA: Diagnosis not present

## 2019-12-11 DIAGNOSIS — R739 Hyperglycemia, unspecified: Secondary | ICD-10-CM

## 2019-12-11 DIAGNOSIS — Z113 Encounter for screening for infections with a predominantly sexual mode of transmission: Secondary | ICD-10-CM | POA: Diagnosis present

## 2019-12-11 DIAGNOSIS — N912 Amenorrhea, unspecified: Secondary | ICD-10-CM | POA: Diagnosis not present

## 2019-12-11 DIAGNOSIS — B9689 Other specified bacterial agents as the cause of diseases classified elsewhere: Secondary | ICD-10-CM | POA: Diagnosis not present

## 2019-12-11 DIAGNOSIS — A749 Chlamydial infection, unspecified: Secondary | ICD-10-CM | POA: Insufficient documentation

## 2019-12-11 DIAGNOSIS — Z124 Encounter for screening for malignant neoplasm of cervix: Secondary | ICD-10-CM | POA: Insufficient documentation

## 2019-12-11 DIAGNOSIS — D649 Anemia, unspecified: Secondary | ICD-10-CM

## 2019-12-11 DIAGNOSIS — B373 Candidiasis of vulva and vagina: Secondary | ICD-10-CM | POA: Insufficient documentation

## 2019-12-11 DIAGNOSIS — E282 Polycystic ovarian syndrome: Secondary | ICD-10-CM

## 2019-12-11 DIAGNOSIS — N760 Acute vaginitis: Secondary | ICD-10-CM | POA: Diagnosis not present

## 2019-12-11 DIAGNOSIS — R5383 Other fatigue: Secondary | ICD-10-CM

## 2019-12-11 DIAGNOSIS — Z0000 Encounter for general adult medical examination without abnormal findings: Secondary | ICD-10-CM | POA: Diagnosis not present

## 2019-12-11 LAB — CBC WITH DIFFERENTIAL/PLATELET
Basophils Absolute: 0 10*3/uL (ref 0.0–0.1)
Basophils Relative: 0.4 % (ref 0.0–3.0)
Eosinophils Absolute: 0.1 10*3/uL (ref 0.0–0.7)
Eosinophils Relative: 1.2 % (ref 0.0–5.0)
HCT: 37.1 % (ref 36.0–46.0)
Hemoglobin: 12.5 g/dL (ref 12.0–15.0)
Lymphocytes Relative: 30.3 % (ref 12.0–46.0)
Lymphs Abs: 1.9 10*3/uL (ref 0.7–4.0)
MCHC: 33.7 g/dL (ref 30.0–36.0)
MCV: 81.7 fl (ref 78.0–100.0)
Monocytes Absolute: 0.3 10*3/uL (ref 0.1–1.0)
Monocytes Relative: 5.4 % (ref 3.0–12.0)
Neutro Abs: 3.9 10*3/uL (ref 1.4–7.7)
Neutrophils Relative %: 62.7 % (ref 43.0–77.0)
Platelets: 287 10*3/uL (ref 150.0–400.0)
RBC: 4.54 Mil/uL (ref 3.87–5.11)
RDW: 15.1 % (ref 11.5–15.5)
WBC: 6.2 10*3/uL (ref 4.0–10.5)

## 2019-12-11 LAB — VITAMIN B12: Vitamin B-12: 483 pg/mL (ref 211–911)

## 2019-12-11 LAB — IBC + FERRITIN
Ferritin: 114 ng/mL (ref 10.0–291.0)
Iron: 19 ug/dL — ABNORMAL LOW (ref 42–145)
Saturation Ratios: 5.3 % — ABNORMAL LOW (ref 20.0–50.0)
Transferrin: 254 mg/dL (ref 212.0–360.0)

## 2019-12-11 LAB — HCG, QUANTITATIVE, PREGNANCY: Quantitative HCG: <0.6 m[IU]/mL

## 2019-12-11 LAB — VITAMIN D 25 HYDROXY (VIT D DEFICIENCY, FRACTURES): VITD: 12.12 ng/mL — ABNORMAL LOW (ref 30.00–100.00)

## 2019-12-11 LAB — TSH: TSH: 3.22 u[IU]/mL (ref 0.35–4.50)

## 2019-12-11 LAB — POCT URINE PREGNANCY: Preg Test, Ur: NEGATIVE

## 2019-12-11 LAB — HEMOGLOBIN A1C: Hgb A1c MFr Bld: 6.1 % (ref 4.6–6.5)

## 2019-12-11 NOTE — Progress Notes (Signed)
Hinata Dixon-Diop DOB: 1995-12-28 Encounter date: 12/11/2019  This is a 24 y.o. female who presents for complete physical   History of present illness/Additional concerns: Took 4 pregnancy tests at home and it didn't yield results. Was clear blue test.   LMP was early March and was spotting. Not real cycle. Feb was light too. Typically had been regular before this. No vaginal discharge, irritation, odor. 1 partner.   Occasional cramping; intermittent.   Has been a little more tired than usual. Working at home. Some breast tenderness.   Hasn't taken fluid pills in a few days.   She is still taking her ocp. Prior to last couple of months of spotting she was getting regular cycle.   Breathing has been ok. Last albuterol use was December.     Past Medical History:  Diagnosis Date  . PCOS (polycystic ovarian syndrome)    Past Surgical History:  Procedure Laterality Date  . TONSILLECTOMY     No Known Allergies Current Meds  Medication Sig  . albuterol (PROVENTIL HFA;VENTOLIN HFA) 108 (90 Base) MCG/ACT inhaler Inhale 1-2 puffs into the lungs every 6 (six) hours as needed for wheezing or shortness of breath.  . fluticasone (FLONASE) 50 MCG/ACT nasal spray Place 1 spray into both nostrils daily.  . furosemide (LASIX) 20 MG tablet Take 1 tablet (20 mg total) by mouth daily as needed for edema.  Marland Kitchen levonorgestrel-ethinyl estradiol (AVIANE) 0.1-20 MG-MCG tablet Take 1 tablet by mouth daily.  . metFORMIN (GLUCOPHAGE) 500 MG tablet Take 1 tablet (500 mg total) by mouth 2 (two) times daily with a meal.   Social History   Tobacco Use  . Smoking status: Never Smoker  . Smokeless tobacco: Never Used  Substance Use Topics  . Alcohol use: Yes    Alcohol/week: 0.0 standard drinks    Comment: occasional   Family History  Problem Relation Age of Onset  . Diabetes Maternal Grandfather   . Diabetes Paternal Grandfather   . Diabetes Brother   . Diabetes Maternal Grandmother   . Healthy  Brother   . Healthy Brother      Review of Systems  Constitutional: Negative for activity change, appetite change, chills, fatigue, fever and unexpected weight change.  HENT: Negative for congestion, ear pain, hearing loss, sinus pressure, sinus pain, sore throat and trouble swallowing.   Eyes: Negative for pain and visual disturbance.  Respiratory: Negative for cough, chest tightness, shortness of breath and wheezing.   Cardiovascular: Negative for chest pain, palpitations and leg swelling.  Gastrointestinal: Negative for abdominal pain, blood in stool, constipation, diarrhea, nausea and vomiting.  Genitourinary: Negative for difficulty urinating, menstrual problem, vaginal discharge and vaginal pain.  Musculoskeletal: Negative for arthralgias and back pain.  Skin: Negative for rash.  Neurological: Negative for dizziness, weakness, numbness and headaches.  Hematological: Negative for adenopathy. Does not bruise/bleed easily.  Psychiatric/Behavioral: Negative for sleep disturbance and suicidal ideas. The patient is not nervous/anxious.     CBC:  Lab Results  Component Value Date   WBC 5.9 01/29/2019   HGB 11.9 (L) 01/29/2019   HCT 35.3 (L) 01/29/2019   MCHC 33.8 01/29/2019   RDW 14.6 01/29/2019   PLT 291.0 01/29/2019   CMP: Lab Results  Component Value Date   NA 140 01/29/2019   K 4.0 01/29/2019   CL 104 01/29/2019   CO2 30 01/29/2019   GLUCOSE 89 01/29/2019   BUN 17 01/29/2019   CREATININE 0.58 01/29/2019   CALCIUM 9.0 01/29/2019   PROT 7.0  01/29/2019   BILITOT 0.2 01/29/2019   ALKPHOS 91 01/29/2019   ALT 37 (H) 01/29/2019   AST 24 01/29/2019   LIPID: Lab Results  Component Value Date   CHOL 149 01/29/2019   TRIG 50.0 01/29/2019   HDL 52.30 01/29/2019   LDLCALC 87 01/29/2019    Objective:  BP 122/70 (BP Location: Left Arm, Patient Position: Sitting, Cuff Size: Normal)   Pulse (!) 107   Temp (!) 97.1 F (36.2 C) (Temporal)   Ht 5' 2.5" (1.588 m)   Wt (!)  347 lb 14.4 oz (157.8 kg)   LMP 11/01/2019 (Exact Date)   SpO2 97%   BMI 62.62 kg/m   Weight: (!) 347 lb 14.4 oz (157.8 kg)   BP Readings from Last 3 Encounters:  12/11/19 122/70  07/21/19 124/78  02/17/19 110/70   Wt Readings from Last 3 Encounters:  12/11/19 (!) 347 lb 14.4 oz (157.8 kg)  07/21/19 (!) 326 lb 12.8 oz (148.2 kg)  02/17/19 (!) 326 lb 9.6 oz (148.1 kg)    Physical Exam Constitutional:      General: She is not in acute distress.    Appearance: She is well-developed.  HENT:     Head: Normocephalic and atraumatic.     Right Ear: External ear normal.     Left Ear: External ear normal.     Mouth/Throat:     Pharynx: No oropharyngeal exudate.  Eyes:     Conjunctiva/sclera: Conjunctivae normal.     Pupils: Pupils are equal, round, and reactive to light.  Neck:     Thyroid: No thyromegaly.  Cardiovascular:     Rate and Rhythm: Normal rate and regular rhythm.     Heart sounds: Normal heart sounds. No murmur. No friction rub. No gallop.   Pulmonary:     Effort: Pulmonary effort is normal.     Breath sounds: Normal breath sounds.  Abdominal:     General: Bowel sounds are normal. There is no distension.     Palpations: Abdomen is soft. There is no mass.     Tenderness: There is no abdominal tenderness. There is no guarding.     Hernia: No hernia is present.  Musculoskeletal:        General: No tenderness or deformity. Normal range of motion.     Cervical back: Normal range of motion and neck supple.  Lymphadenopathy:     Cervical: No cervical adenopathy.  Skin:    General: Skin is warm and dry.     Findings: No rash.  Neurological:     Mental Status: She is alert and oriented to person, place, and time.     Deep Tendon Reflexes: Reflexes normal.     Reflex Scores:      Tricep reflexes are 2+ on the right side and 2+ on the left side.      Bicep reflexes are 2+ on the right side and 2+ on the left side.      Brachioradialis reflexes are 2+ on the right side  and 2+ on the left side.      Patellar reflexes are 2+ on the right side and 2+ on the left side. Psychiatric:        Speech: Speech normal.        Behavior: Behavior normal.        Thought Content: Thought content normal.     Assessment/Plan: Health Maintenance Due  Topic Date Due  . HIV Screening  Never done  . PAP-Cervical Cytology  Screening  Never done  . PAP SMEAR-Modifier  Never done   Health Maintenance reviewed.  1. Preventative health care We briefly discussed weight gain today.  She was upset with the amount her weight had increased.  We decided to obtain further blood work and then will have a more elaborate discussion about weight loss.  2. Hyperglycemia - Hemoglobin A1c; Future - Hemoglobin A1c  3. PCOS (polycystic ovarian syndrome) Is taking metformin; on ocp for period regulation although cycle has been light recently. See below.  4. Amenorrhea Will get serum test to confirm negative urine result in office. - HCG, Quant, Pregnancy; Future - TSH; Future - TSH - HCG, Quant, Pregnancy - POCT urine pregnancy  5. Anemia, unspecified type - CBC with Differential/Platelet; Future - IBC + Ferritin; Future - Vitamin B12; Future - Vitamin B12 - IBC + Ferritin - CBC with Differential/Platelet  6. Fatigue, unspecified type - VITAMIN D 25 Hydroxy (Vit-D Deficiency, Fractures); Future - VITAMIN D 25 Hydroxy (Vit-D Deficiency, Fractures)  7. Screening examination for STD (sexually transmitted disease) - HIV Antibody (routine testing w rflx); Future - RPR; Future - Cervicovaginal ancillary only - RPR - HIV Antibody (routine testing w rflx)  8. Cervical cancer screening - PAP [[place]]  Return for pending labs.  Theodis Shove, MD

## 2019-12-14 LAB — HIV ANTIBODY (ROUTINE TESTING W REFLEX): HIV 1&2 Ab, 4th Generation: NONREACTIVE

## 2019-12-14 LAB — RPR: RPR Ser Ql: NONREACTIVE

## 2019-12-15 ENCOUNTER — Other Ambulatory Visit: Payer: Self-pay | Admitting: Family Medicine

## 2019-12-15 LAB — CERVICOVAGINAL ANCILLARY ONLY
Bacterial Vaginitis (gardnerella): POSITIVE — AB
Candida Glabrata: NEGATIVE
Candida Vaginitis: POSITIVE — AB
Chlamydia: POSITIVE — AB
Comment: NEGATIVE
Comment: NEGATIVE
Comment: NEGATIVE
Comment: NEGATIVE
Comment: NEGATIVE
Comment: NORMAL
Neisseria Gonorrhea: NEGATIVE
Trichomonas: NEGATIVE

## 2019-12-15 MED ORDER — VITAMIN D (ERGOCALCIFEROL) 1.25 MG (50000 UNIT) PO CAPS: 50000.0000 [IU] | ORAL_CAPSULE | ORAL | 0 refills | Status: DC

## 2019-12-15 MED ORDER — FLUCONAZOLE 150 MG PO TABS: 150.0000 mg | ORAL_TABLET | Freq: Once | ORAL | 0 refills | Status: AC

## 2019-12-15 MED ORDER — METRONIDAZOLE 500 MG PO TABS: 500.0000 mg | ORAL_TABLET | Freq: Two times a day (BID) | ORAL | 0 refills | Status: DC

## 2019-12-15 MED ORDER — AZITHROMYCIN 500 MG PO TABS: 1000.0000 mg | ORAL_TABLET | Freq: Every day | ORAL | 0 refills | Status: DC

## 2019-12-15 NOTE — Addendum Note (Signed)
Addended by: Johnella Moloney on: 12/15/2019 08:46 AM   Modules accepted: Orders

## 2019-12-17 LAB — CYTOLOGY - PAP
Comment: NEGATIVE
Diagnosis: UNDETERMINED — AB
High risk HPV: NEGATIVE

## 2019-12-18 ENCOUNTER — Telehealth: Payer: Self-pay | Admitting: Family Medicine

## 2019-12-18 NOTE — Telephone Encounter (Signed)
Pt was returning JoAnnes call about lab results.   Pt can be reached at [phone]

## 2019-12-21 NOTE — Telephone Encounter (Signed)
See results note. 

## 2019-12-24 NOTE — Progress Notes (Signed)
Virtual Visit via Video Note  I connected with Colleen Webb  on 12/25/19 at  8:30 AM EDT by a video enabled telemedicine application and verified that I am speaking with the correct person using two identifiers.  Location patient: home Location provider:work or home office Persons participating in the virtual visit: patient, provider  I discussed the limitations of evaluation and management by telemedicine and the availability of in person appointments. The patient expressed understanding and agreed to proceed.   Colleen Webb DOB: Nov 04, 1995 Encounter date: 12/25/2019  This is a 24 y.o. female who presents with Chief Complaint  Patient presents with  . Follow-up    History of present illness:  didn't follow through with sleep study referral that was placed last year.   A1C 6.1 on recent bloodwork with patient on metformin 500mg  BID for PCOS.  Having some pain in lower back. This seemed to come out of nowhere. No known injury. Has been limiting her ability to walk somewhat. Does go away with stretching.  She has been frustrated in past with trying to lose weight. She will have some weight loss, but it never stays off or plateaus. She always looses less than others she exercises or diets with. She would like to get some of weight off so that she can be healthier and wishes to prevent worsening blood sugars as she has seen complications of that with family members who are diabetic.   No Known Allergies Current Meds  Medication Sig  . albuterol (PROVENTIL HFA;VENTOLIN HFA) 108 (90 Base) MCG/ACT inhaler Inhale 1-2 puffs into the lungs every 6 (six) hours as needed for wheezing or shortness of breath.  . fluticasone (FLONASE) 50 MCG/ACT nasal spray Place 1 spray into both nostrils daily.  . furosemide (LASIX) 20 MG tablet Take 1 tablet (20 mg total) by mouth daily as needed for edema.  Marland Kitchen levonorgestrel-ethinyl estradiol (AVIANE) 0.1-20 MG-MCG tablet Take 1 tablet by mouth daily.   . metFORMIN (GLUCOPHAGE) 500 MG tablet Take 1 tablet (500 mg total) by mouth 2 (two) times daily with a meal.  . metroNIDAZOLE (FLAGYL) 500 MG tablet Take 1 tablet (500 mg total) by mouth 2 (two) times daily. Do not drink alcohol while taking this medication.  . Vitamin D, Ergocalciferol, (DRISDOL) 1.25 MG (50000 UNIT) CAPS capsule Take 1 capsule (50,000 Units total) by mouth every 7 (seven) days.    Review of Systems  Constitutional: Negative for chills, fatigue and fever.  Respiratory: Negative for cough, chest tightness, shortness of breath and wheezing.   Cardiovascular: Negative for chest pain, palpitations and leg swelling.    Objective:  LMP 10/31/2019 (Approximate)       BP Readings from Last 3 Encounters:  12/11/19 122/70  07/21/19 124/78  02/17/19 110/70   Wt Readings from Last 3 Encounters:  12/11/19 (!) 347 lb 14.4 oz (157.8 kg)  07/21/19 (!) 326 lb 12.8 oz (148.2 kg)  02/17/19 (!) 326 lb 9.6 oz (148.1 kg)    EXAM:  GENERAL: alert, oriented, appears well and in no acute distress  HEENT: atraumatic, conjunctiva clear, no obvious abnormalities on inspection of external nose and ears  NECK: normal movements of the head and neck  LUNGS: on inspection no signs of respiratory distress, breathing rate appears normal, no obvious gross SOB, gasping or wheezing  CV: no obvious cyanosis  MS: moves all visible extremities without noticeable abnormality  PSYCH/NEURO: pleasant and cooperative, no obvious depression or anxiety, speech and thought processing grossly intact   Assessment/Plan  1. Sleep apnea, unspecified type She would like to complete this and is going to call today to set up appointment. Referral replaced since it looks like this was closed previously. - Ambulatory referral to Sleep Studies  2. Midline low back pain without sciatica, unspecified chronicity Mild; discussed regular stretching (which is helpful); she has follow up set in July to recheck  and will let me know if worsening.  3. Morbid obesity (HCC) She is motivated to lose weight. I do think she will do better with more structured plan and regular follow up.  - Amb Ref to Medical Weight Management  4. Hyperglycemia Increase metformin to 1000mg  BID. We discussed working on exercise with high intensity intervals. We discussed limiting carbs, sweets.  5. PCOS (polycystic ovarian syndrome) On metformin. We discussed benefits of weight loss for future pregnancy planning. - metFORMIN (GLUCOPHAGE) 500 MG tablet; Take 2 tablets (1,000 mg total) by mouth 2 (two) times daily with a meal.  Dispense: 360 tablet; Refill: 1   Has f/u in July; can recheck A1C at that time  I discussed the assessment and treatment plan with the patient. The patient was provided an opportunity to ask questions and all were answered. The patient agreed with the plan and demonstrated an understanding of the instructions.   The patient was advised to call back or seek an in-person evaluation if the symptoms worsen or if the condition fails to improve as anticipated.  I provided 30 minutes of non-face-to-face time during this encounter.   August, MD

## 2019-12-25 ENCOUNTER — Encounter: Payer: Self-pay | Admitting: Family Medicine

## 2019-12-25 ENCOUNTER — Telehealth (INDEPENDENT_AMBULATORY_CARE_PROVIDER_SITE_OTHER): Payer: BC Managed Care – PPO | Admitting: Family Medicine

## 2019-12-25 DIAGNOSIS — R739 Hyperglycemia, unspecified: Secondary | ICD-10-CM

## 2019-12-25 DIAGNOSIS — M545 Low back pain, unspecified: Secondary | ICD-10-CM

## 2019-12-25 DIAGNOSIS — E282 Polycystic ovarian syndrome: Secondary | ICD-10-CM

## 2019-12-25 DIAGNOSIS — G4730 Sleep apnea, unspecified: Secondary | ICD-10-CM

## 2019-12-25 MED ORDER — METFORMIN HCL 500 MG PO TABS: 1000.0000 mg | ORAL_TABLET | Freq: Two times a day (BID) | ORAL | 1 refills | Status: DC

## 2020-01-04 ENCOUNTER — Encounter: Payer: Self-pay | Admitting: Family Medicine

## 2020-01-05 ENCOUNTER — Encounter (INDEPENDENT_AMBULATORY_CARE_PROVIDER_SITE_OTHER): Payer: Self-pay | Admitting: Bariatrics

## 2020-01-13 ENCOUNTER — Ambulatory Visit (INDEPENDENT_AMBULATORY_CARE_PROVIDER_SITE_OTHER): Payer: BC Managed Care – PPO | Admitting: Bariatrics

## 2020-01-13 ENCOUNTER — Encounter (INDEPENDENT_AMBULATORY_CARE_PROVIDER_SITE_OTHER): Payer: Self-pay | Admitting: Bariatrics

## 2020-01-13 ENCOUNTER — Telehealth: Payer: Self-pay | Admitting: Neurology

## 2020-01-13 ENCOUNTER — Other Ambulatory Visit: Payer: Self-pay

## 2020-01-13 ENCOUNTER — Institutional Professional Consult (permissible substitution): Payer: Self-pay | Admitting: Neurology

## 2020-01-13 VITALS — BP 122/77 | HR 112 | Temp 98.5°F | Ht 61.0 in | Wt 341.0 lb

## 2020-01-13 DIAGNOSIS — R7303 Prediabetes: Secondary | ICD-10-CM | POA: Diagnosis not present

## 2020-01-13 DIAGNOSIS — Z9189 Other specified personal risk factors, not elsewhere classified: Secondary | ICD-10-CM | POA: Diagnosis not present

## 2020-01-13 DIAGNOSIS — R0602 Shortness of breath: Secondary | ICD-10-CM

## 2020-01-13 DIAGNOSIS — E559 Vitamin D deficiency, unspecified: Secondary | ICD-10-CM

## 2020-01-13 DIAGNOSIS — R5383 Other fatigue: Secondary | ICD-10-CM

## 2020-01-13 DIAGNOSIS — Z1331 Encounter for screening for depression: Secondary | ICD-10-CM | POA: Diagnosis not present

## 2020-01-13 DIAGNOSIS — G4739 Other sleep apnea: Secondary | ICD-10-CM

## 2020-01-13 DIAGNOSIS — Z0289 Encounter for other administrative examinations: Secondary | ICD-10-CM

## 2020-01-13 DIAGNOSIS — Z6841 Body Mass Index (BMI) 40.0 and over, adult: Secondary | ICD-10-CM

## 2020-01-13 NOTE — Telephone Encounter (Signed)
PT has called and stated due to work conflict she was unable to make apt for today. Pt is counted as no show for sleep consult

## 2020-01-14 LAB — LIPID PANEL WITH LDL/HDL RATIO
Cholesterol, Total: 171 mg/dL (ref 100–199)
HDL: 63 mg/dL (ref >39)
LDL Chol Calc (NIH): 98 mg/dL (ref 0–99)
LDL/HDL Ratio: 1.6 ratio (ref 0.0–3.2)
Triglycerides: 52 mg/dL (ref 0–149)
VLDL Cholesterol Cal: 10 mg/dL (ref 5–40)

## 2020-01-14 LAB — COMPREHENSIVE METABOLIC PANEL
ALT: 32 IU/L (ref 0–32)
AST: 23 IU/L (ref 0–40)
Albumin/Globulin Ratio: 0.9 — ABNORMAL LOW (ref 1.2–2.2)
Albumin: 3.7 g/dL — ABNORMAL LOW (ref 3.9–5.0)
Alkaline Phosphatase: 97 IU/L (ref 48–121)
BUN/Creatinine Ratio: 25 — ABNORMAL HIGH (ref 9–23)
BUN: 16 mg/dL (ref 6–20)
Bilirubin Total: <0.2 mg/dL (ref 0.0–1.2)
CO2: 27 mmol/L (ref 20–29)
Calcium: 9.3 mg/dL (ref 8.7–10.2)
Chloride: 103 mmol/L (ref 96–106)
Creatinine, Ser: 0.65 mg/dL (ref 0.57–1.00)
GFR calc Af Amer: 144 mL/min/{1.73_m2} (ref >59)
GFR calc non Af Amer: 125 mL/min/{1.73_m2} (ref >59)
Globulin, Total: 4 g/dL (ref 1.5–4.5)
Glucose: 105 mg/dL — ABNORMAL HIGH (ref 65–99)
Potassium: 4.6 mmol/L (ref 3.5–5.2)
Sodium: 141 mmol/L (ref 134–144)
Total Protein: 7.7 g/dL (ref 6.0–8.5)

## 2020-01-14 LAB — INSULIN, RANDOM: INSULIN: 35.6 u[IU]/mL — ABNORMAL HIGH (ref 2.6–24.9)

## 2020-01-14 LAB — T3: T3, Total: 234 ng/dL — ABNORMAL HIGH (ref 71–180)

## 2020-01-14 LAB — TSH: TSH: 2.47 u[IU]/mL (ref 0.450–4.500)

## 2020-01-14 LAB — T4, FREE: Free T4: 1.22 ng/dL (ref 0.82–1.77)

## 2020-01-14 NOTE — Progress Notes (Signed)
Dear Dr. Ethlyn Gallery,   Thank you for referring Colleen Webb to our clinic. The following note includes my evaluation and treatment recommendations.  Chief Complaint:   OBESITY Colleen Webb (MR# [id number]) is a 24 y.o. female who presents for evaluation and treatment of obesity and related comorbidities. Current BMI is Body mass index is 64.43 kg/m. Colleen Webb has been struggling with her weight for many years and has been unsuccessful in either losing weight, maintaining weight loss, or reaching her healthy weight goal.  Colleen Webb is currently in the action stage of change and ready to dedicate time achieving and maintaining a healthier weight. Colleen Webb is interested in becoming our patient and working on intensive lifestyle modifications including (but not limited to) diet and exercise for weight loss.  Colleen Webb states that she is sometimes a picky eater.  She does sometimes wake up hungry and eat cereal.  She skips meals.  Colleen Webb's habits were reviewed today and are as follows: she struggles with family and or coworkers weight loss sabotage, her desired weight loss is 141 pounds, she has been heavy most of her life, she started gaining weight between [age] years old, her heaviest weight ever was 340 pounds, she is sometimes a picky eater, she snacks frequently in the evenings, she wakes up sometimes in the middle of the night to eat, she skips breakfast or dinner at times, she is frequently drinking liquids with calories, she frequently makes poor food choices, she has problems with excessive hunger, she frequently eats larger portions than normal and she struggles with emotional eating.  Depression Screen Colleen Webb's Food and Mood (modified PHQ-9) score was 20.  Depression screen PHQ 2/9 01/13/2020  Decreased Interest 3  Down, Depressed, Hopeless 3  PHQ - 2 Score 6  Altered sleeping 3  Tired, decreased energy 3  Change in appetite 3  Feeling bad or failure about yourself  1  Trouble  concentrating 2  Moving slowly or fidgety/restless 2  Suicidal thoughts 0  PHQ-9 Score 20  Difficult doing work/chores Very difficult   Subjective:   1. Other fatigue Quanesha admits to daytime somnolence and reports waking up still tired. Patent has a history of symptoms of daytime fatigue, morning fatigue, morning headache and snoring. Forestine generally gets 3 or 4 hours of sleep per night, and states that she has poor quality sleep. Snoring is present. Apneic episodes may be present. Epworth Sleepiness Score is 14.  2. SOB (shortness of breath) on exertion Colleen Webb notes increasing shortness of breath with exercising and seems to be worsening over time with weight gain. She notes getting out of breath sooner with activity than she used to. This has gotten worse recently. Colleen Webb denies shortness of breath at rest or orthopnea.  3. Vitamin D deficiency Colleen Webb's Vitamin D level was 12.12 on 12/11/2019. She is currently taking prescription vitamin D 50,000 IU each week. She denies nausea, vomiting or muscle weakness.  4. Prediabetes Colleen Webb has a diagnosis of prediabetes based on her elevated HgA1c and was informed this puts her at greater risk of developing diabetes. She continues to work on diet and exercise to decrease her risk of diabetes. She denies nausea or hypoglycemia.  She is taking metformin 1000 mg twice daily.  Lab Results  Component Value Date   HGBA1C 6.1 12/11/2019   Lab Results  Component Value Date   INSULIN 35.6 (H) 01/13/2020   5. Other sleep apnea Colleen Webb has a diagnosis of sleep apnea. She reports that she is not  using a CPAP regularly.   6. Depression screening Kilani was screened for depression as part of her new patient workup.  7. At risk for diabetes mellitus Colleen Webb is at higher than average risk for developing diabetes due to her obesity.   Assessment/Plan:   1. Other fatigue Colleen Webb does feel that her weight is causing her energy to be lower than it should be. Fatigue  may be related to obesity, depression or many other causes. Labs will be ordered, and in the meanwhile, Colleen Webb will focus on self care including making healthy food choices, increasing physical activity and focusing on stress reduction. - EKG 12-Lead - Comprehensive metabolic panel - TSH - T4, free - T3  2. SOB (shortness of breath) on exertion Colleen Webb does feel that she gets out of breath more easily that she used to when she exercises. Colleen Webb's shortness of breath appears to be obesity related and exercise induced. She has agreed to work on weight loss and gradually increase exercise to treat her exercise induced shortness of breath. Will continue to monitor closely. - Lipid Panel With LDL/HDL Ratio  3. Vitamin D deficiency Low Vitamin D level contributes to fatigue and are associated with obesity, breast, and colon cancer. She agrees to continue to take prescription Vitamin D @50 ,000 IU every week and will follow-up for routine testing of Vitamin D, at least 2-3 times per year to avoid over-replacement.  4. Prediabetes Colleen Webb will continue to work on weight loss, exercise, and decreasing simple carbohydrates to help decrease the risk of diabetes.  She will continue metformin. - Insulin, random  5. Other sleep apnea Colleen Webb will follow-up with Neurology today for new evaluation.  Intensive lifestyle modifications are the first line treatment for this issue. We discussed several lifestyle modifications today and she will continue to work on diet, exercise and weight loss efforts. We will continue to monitor. Orders and follow up as documented in patient record.   Counseling  Sleep apnea is a condition in which breathing pauses or becomes shallow during sleep. This happens over and over during the night. This disrupts your sleep and keeps your body from getting the rest that it needs, which can cause tiredness and lack of energy (fatigue) during the day.  Sleep apnea treatment: If you were given a  device to open your airway while you sleep, USE IT!  Sleep hygiene:   Limit or avoid alcohol, caffeinated beverages, and cigarettes, especially close to bedtime.   Do not eat a large meal or eat spicy foods right before bedtime. This can lead to digestive discomfort that can make it hard for you to sleep.  Keep a sleep diary to help you and your health care provider figure out what could be causing your insomnia.  . Make your bedroom a dark, comfortable place where it is easy to fall asleep. ? Put up shades or blackout curtains to block light from outside. ? Use a white noise machine to block noise. ? Keep the temperature cool. . Limit screen use before bedtime. This includes: ? Watching TV. ? Using your smartphone, tablet, or computer. . Stick to a routine that includes going to bed and waking up at the same times every day and night. This can help you fall asleep faster. Consider making a quiet activity, such as reading, part of your nighttime routine. . Try to avoid taking naps during the day so that you sleep better at night. . Get out of bed if you are still awake after  15 minutes of trying to sleep. Keep the lights down, but try reading or doing a quiet activity. When you feel sleepy, go back to bed.  6. Depression screening Tamar had a positive depression screening. Depression is commonly associated with obesity and often results in emotional eating behaviors. We will monitor this closely and work on CBT to help improve the non-hunger eating patterns. Referral to Psychology may be required if no improvement is seen as she continues in our clinic.  7. At risk for diabetes mellitus Tiyonna was given approximately 15 minutes of diabetes education and counseling today. We discussed intensive lifestyle modifications today with an emphasis on weight loss as well as increasing exercise and decreasing simple carbohydrates in her diet. We also reviewed medication options with an emphasis on risk  versus benefit of those discussed.   Repetitive spaced learning was employed today to elicit superior memory formation and behavioral change.  8. Class 3 severe obesity with serious comorbidity and body mass index (BMI) of 60.0 to 69.9 in adult, unspecified obesity type (HCC) Yanelis is currently in the action stage of change and her goal is to continue with weight loss efforts. I recommend Diarra begin the structured treatment plan as follows:  She has agreed to the Category 4 Plan -100 calories.  She will work on meal planning and decreasing portion size.  Labs were reviewed with the patient today from 01/29/2019 (CMP, lipids, CBC) and from 12/11/2019  (iron profile, CBC, A1c).   Exercise goals: No exercise has been prescribed at this time.   Behavioral modification strategies: increasing lean protein intake, decreasing simple carbohydrates, increasing vegetables, increasing water intake, decreasing eating out, no skipping meals, meal planning and cooking strategies, keeping healthy foods in the home and planning for success.  She was informed of the importance of frequent follow-up visits to maximize her success with intensive lifestyle modifications for her multiple health conditions. She was informed we would discuss her lab results at her next visit unless there is a critical issue that needs to be addressed sooner. Dinita agreed to keep her next visit at the agreed upon time to discuss these results.  Objective:   Blood pressure 122/77, pulse (!) 112, temperature 98.5 F (36.9 C), height 5\' 1"  (1.549 m), weight (!) 341 lb (154.7 kg), last menstrual period 11/17/2019, SpO2 96 %. Body mass index is 64.43 kg/m.  EKG: Normal sinus rhythm, rate 111 bpm.  Indirect Calorimeter completed today shows a VO2 of 354 and a REE of 2473.  Her calculated basal metabolic rate is 11/19/2019 thus her basal metabolic rate is better than expected.  General: Cooperative, alert, well developed, in no acute  distress. HEENT: Conjunctivae and lids unremarkable. Cardiovascular: Regular rhythm.  Lungs: Normal work of breathing. Neurologic: No focal deficits.   Lab Results  Component Value Date   CREATININE 0.65 01/13/2020   BUN 16 01/13/2020   NA 141 01/13/2020   K 4.6 01/13/2020   CL 103 01/13/2020   CO2 27 01/13/2020   Lab Results  Component Value Date   ALT 32 01/13/2020   AST 23 01/13/2020   ALKPHOS 97 01/13/2020   BILITOT <0.2 01/13/2020   Lab Results  Component Value Date   HGBA1C 6.1 12/11/2019   HGBA1C 6.0 01/29/2019   Lab Results  Component Value Date   INSULIN 35.6 (H) 01/13/2020   Lab Results  Component Value Date   TSH 2.470 01/13/2020   Lab Results  Component Value Date   CHOL 171 01/13/2020  HDL 63 01/13/2020   LDLCALC 98 01/13/2020   TRIG 52 01/13/2020   CHOLHDL 3 01/29/2019   Lab Results  Component Value Date   WBC 6.2 12/11/2019   HGB 12.5 12/11/2019   HCT 37.1 12/11/2019   MCV 81.7 12/11/2019   PLT 287.0 12/11/2019   Lab Results  Component Value Date   IRON 19 (L) 12/11/2019   FERRITIN 114.0 12/11/2019   Attestation Statements:   Reviewed by clinician on day of visit: allergies, medications, problem list, medical history, surgical history, family history, social history, and previous encounter notes.  I, Insurance claims handler, CMA, am acting as Energy manager for [employer], DO.  I have reviewed the above documentation for accuracy and completeness, and I agree with the above. Corinna Capra, DO

## 2020-01-18 ENCOUNTER — Encounter (INDEPENDENT_AMBULATORY_CARE_PROVIDER_SITE_OTHER): Payer: Self-pay | Admitting: Bariatrics

## 2020-01-21 DIAGNOSIS — Z0289 Encounter for other administrative examinations: Secondary | ICD-10-CM

## 2020-01-24 ENCOUNTER — Encounter: Payer: Self-pay | Admitting: Family Medicine

## 2020-01-26 ENCOUNTER — Other Ambulatory Visit: Payer: Self-pay | Admitting: *Deleted

## 2020-01-26 ENCOUNTER — Other Ambulatory Visit: Payer: Self-pay | Admitting: Family Medicine

## 2020-01-26 MED ORDER — ALBUTEROL SULFATE HFA 108 (90 BASE) MCG/ACT IN AERS: 1.0000 | INHALATION_SPRAY | Freq: Four times a day (QID) | RESPIRATORY_TRACT | 2 refills | Status: DC | PRN

## 2020-01-26 NOTE — Telephone Encounter (Signed)
Rx re-sent via escribe as the previous Rx was printed.

## 2020-01-27 ENCOUNTER — Encounter (INDEPENDENT_AMBULATORY_CARE_PROVIDER_SITE_OTHER): Payer: Self-pay | Admitting: Bariatrics

## 2020-01-27 ENCOUNTER — Ambulatory Visit (INDEPENDENT_AMBULATORY_CARE_PROVIDER_SITE_OTHER): Payer: BC Managed Care – PPO | Admitting: Bariatrics

## 2020-01-27 ENCOUNTER — Other Ambulatory Visit: Payer: Self-pay

## 2020-01-27 VITALS — BP 121/73 | HR 109 | Temp 98.3°F | Ht 61.0 in | Wt 339.0 lb

## 2020-01-27 DIAGNOSIS — E66813 Obesity, class 3: Secondary | ICD-10-CM

## 2020-01-27 DIAGNOSIS — R7303 Prediabetes: Secondary | ICD-10-CM | POA: Diagnosis not present

## 2020-01-27 DIAGNOSIS — Z6841 Body Mass Index (BMI) 40.0 and over, adult: Secondary | ICD-10-CM

## 2020-01-27 DIAGNOSIS — G4739 Other sleep apnea: Secondary | ICD-10-CM

## 2020-01-27 DIAGNOSIS — E559 Vitamin D deficiency, unspecified: Secondary | ICD-10-CM

## 2020-01-27 DIAGNOSIS — Z9189 Other specified personal risk factors, not elsewhere classified: Secondary | ICD-10-CM

## 2020-01-27 DIAGNOSIS — J45909 Unspecified asthma, uncomplicated: Secondary | ICD-10-CM

## 2020-01-27 MED ORDER — ALBUTEROL SULFATE HFA 108 (90 BASE) MCG/ACT IN AERS: 1.0000 | INHALATION_SPRAY | Freq: Four times a day (QID) | RESPIRATORY_TRACT | 1 refills | Status: DC | PRN

## 2020-01-27 NOTE — Progress Notes (Signed)
Chief Complaint:   OBESITY Colleen Webb is here to discuss her progress with her obesity treatment plan along with follow-up of her obesity related diagnoses. Colleen Webb is on the Category 4 Plan and states she is following her eating plan approximately 75% of the time. Colleen Webb states she is walking 30 minutes 2 times per week.  Today's visit was #: 2 Starting weight: 341 lbs Starting date: 01/13/2020 Today's weight: 339 lbs Today's date: 01/27/2020 Total lbs lost to date: 2 Total lbs lost since last in-office visit: 2  Interim History: Colleen Webb is down 2 lbs. She is having a hard time eating her yogurt. She reports doing better with her water intake.  Subjective:   Prediabetes. Colleen Webb has a diagnosis of prediabetes based on her elevated HgA1c and was informed this puts her at greater risk of developing diabetes. She continues to work on diet and exercise to decrease her risk of diabetes. She denies nausea or hypoglycemia.  Lab Results  Component Value Date   HGBA1C 6.1 12/11/2019   Lab Results  Component Value Date   INSULIN 35.6 (H) 01/13/2020   Other sleep apnea. Colleen Webb has a consult scheduled for June 15 (testing scheduled).  Vitamin D deficiency. Colleen Webb is taking high dose Vitamin D. Last Vitamin D 12.12 on 12/11/2019.  Reactive airway disease without complication, unspecified asthma severity, unspecified whether persistent. Colleen Webb uses an inhaler as needed.  At risk for activity intolerance. Colleen Webb is at risk of exercise intolerance due to reactive airway disease and obesity.  Assessment/Plan:   Prediabetes. Colleen Webb will continue to work on weight loss, exercise, increasing healthy fats and protein, and decreasing simple carbohydrates to help decrease the risk of diabetes.   Other sleep apnea. Intensive lifestyle modifications are the first line treatment for this issue. We discussed several lifestyle modifications today and she will continue to work on diet, exercise and weight  loss efforts. We will continue to monitor. Orders and follow up as documented in patient record. We discussed the importance of good, restorative sleep.  Counseling  Sleep apnea is a condition in which breathing pauses or becomes shallow during sleep. This happens over and over during the night. This disrupts your sleep and keeps your body from getting the rest that it needs, which can cause tiredness and lack of energy (fatigue) during the day.  Sleep apnea treatment: If you were given a device to open your airway while you sleep, USE IT!  Sleep hygiene:   Limit or avoid alcohol, caffeinated beverages, and cigarettes, especially close to bedtime.   Do not eat a large meal or eat spicy foods right before bedtime. This can lead to digestive discomfort that can make it hard for you to sleep.  Keep a sleep diary to help you and your health care provider figure out what could be causing your insomnia.  . Make your bedroom a dark, comfortable place where it is easy to fall asleep. ? Put up shades or blackout curtains to block light from outside. ? Use a white noise machine to block noise. ? Keep the temperature cool. . Limit screen use before bedtime. This includes: ? Watching TV. ? Using your smartphone, tablet, or computer. . Stick to a routine that includes going to bed and waking up at the same times every day and night. This can help you fall asleep faster. Consider making a quiet activity, such as reading, part of your nighttime routine. . Try to avoid taking naps during the day so  that you sleep better at night. . Get out of bed if you are still awake after 15 minutes of trying to sleep. Keep the lights down, but try reading or doing a quiet activity. When you feel sleepy, go back to bed.  Vitamin D deficiency. Low Vitamin D level contributes to fatigue and are associated with obesity, breast, and colon cancer. She agrees to continue to take Vitamin D and will follow-up for routine  testing of Vitamin D, at least 2-3 times per year to avoid over-replacement.  Reactive airway disease without complication, unspecified asthma severity, unspecified whether persistent. Prescription was given for albuterol (VENTOLIN HFA) 108 (90 Base) MCG/ACT inhaler 1-2 puffs into the lungs every 6 hours as needed x1 with 0 refill.  At risk for activity intolerance. Colleen Webb was given approximately 15 minutes of exercise intolerance counseling today. She is 24 y.o. female and has risk factors exercise intolerance including obesity. We discussed intensive lifestyle modifications today with an emphasis on specific weight loss instructions and strategies. Colleen Webb will slowly increase activity as tolerated.  Repetitive spaced learning was employed today to elicit superior memory formation and behavioral change.  Class 3 severe obesity with serious comorbidity and body mass index (BMI) of 60.0 to 69.9 in adult, unspecified obesity type (HCC).  Colleen Webb is currently in the action stage of change. As such, her goal is to continue with weight loss efforts. She has agreed to the Category 4 Plan.   She will work on meal planning and intentional eating. Handout was given on Protein Equivalents.  We independently reviewed with the patient labs from 01/13/2020 including CMP, lipids, glucose, insulin, and thyroid panel.  Exercise goals: Colleen Webb will start walking in the evening.  Behavioral modification strategies: increasing lean protein intake, decreasing simple carbohydrates, increasing vegetables, increasing water intake, decreasing eating out, no skipping meals, meal planning and cooking strategies, keeping healthy foods in the home and planning for success.  Colleen Webb has agreed to follow-up with our clinic in 2-3 weeks. She was informed of the importance of frequent follow-up visits to maximize her success with intensive lifestyle modifications for her multiple health conditions.   Objective:   Blood pressure  121/73, pulse (!) 109, temperature 98.3 F (36.8 C), temperature source Oral, height 5\' 1"  (1.549 m), weight (!) 339 lb (153.8 kg), SpO2 95 %. Body mass index is 64.05 kg/m.  General: Cooperative, alert, well developed, in no acute distress. HEENT: Conjunctivae and lids unremarkable. Cardiovascular: Regular rhythm.  Lungs: Normal work of breathing. Neurologic: No focal deficits.   Lab Results  Component Value Date   CREATININE 0.65 01/13/2020   BUN 16 01/13/2020   NA 141 01/13/2020   K 4.6 01/13/2020   CL 103 01/13/2020   CO2 27 01/13/2020   Lab Results  Component Value Date   ALT 32 01/13/2020   AST 23 01/13/2020   ALKPHOS 97 01/13/2020   BILITOT <0.2 01/13/2020   Lab Results  Component Value Date   HGBA1C 6.1 12/11/2019   HGBA1C 6.0 01/29/2019   Lab Results  Component Value Date   INSULIN 35.6 (H) 01/13/2020   Lab Results  Component Value Date   TSH 2.470 01/13/2020   Lab Results  Component Value Date   CHOL 171 01/13/2020   HDL 63 01/13/2020   LDLCALC 98 01/13/2020   TRIG 52 01/13/2020   CHOLHDL 3 01/29/2019   Lab Results  Component Value Date   WBC 6.2 12/11/2019   HGB 12.5 12/11/2019   HCT 37.1 12/11/2019  MCV 81.7 12/11/2019   PLT 287.0 12/11/2019   Lab Results  Component Value Date   IRON 19 (L) 12/11/2019   FERRITIN 114.0 12/11/2019   Attestation Statements:   Reviewed by clinician on day of visit: allergies, medications, problem list, medical history, surgical history, family history, social history, and previous encounter notes.  Migdalia Dk, am acting as Location manager for [employer], DO   I have reviewed the above documentation for accuracy and completeness, and I agree with the above. Jearld Lesch, DO

## 2020-02-09 ENCOUNTER — Institutional Professional Consult (permissible substitution): Payer: Self-pay | Admitting: Neurology

## 2020-02-16 ENCOUNTER — Telehealth: Payer: Self-pay | Admitting: *Deleted

## 2020-02-16 ENCOUNTER — Other Ambulatory Visit: Payer: Self-pay | Admitting: Family Medicine

## 2020-02-16 ENCOUNTER — Ambulatory Visit (INDEPENDENT_AMBULATORY_CARE_PROVIDER_SITE_OTHER): Payer: BC Managed Care – PPO | Admitting: Bariatrics

## 2020-02-16 DIAGNOSIS — E282 Polycystic ovarian syndrome: Secondary | ICD-10-CM

## 2020-02-16 MED ORDER — LEVONORGESTREL-ETHINYL ESTRAD 0.1-20 MG-MCG PO TABS: 1.0000 | ORAL_TABLET | Freq: Every day | ORAL | 3 refills | Status: DC

## 2020-02-16 NOTE — Telephone Encounter (Signed)
Refilled; they must have substituted alternative ocp, but doses are same.

## 2020-02-16 NOTE — Telephone Encounter (Signed)
[facility] faxed a refill request for [place] tablets 28-take 1 tablet by mouth daily-#84 with 1 refill.  Message sent to PCP as Rx is not on the pts current med list.

## 2020-02-17 ENCOUNTER — Ambulatory Visit (INDEPENDENT_AMBULATORY_CARE_PROVIDER_SITE_OTHER): Payer: BC Managed Care – PPO | Admitting: Bariatrics

## 2020-02-17 ENCOUNTER — Encounter (INDEPENDENT_AMBULATORY_CARE_PROVIDER_SITE_OTHER): Payer: Self-pay | Admitting: Bariatrics

## 2020-02-17 ENCOUNTER — Other Ambulatory Visit: Payer: Self-pay

## 2020-02-17 VITALS — BP 124/84 | HR 104 | Temp 98.2°F | Ht 61.0 in | Wt 343.0 lb

## 2020-02-17 DIAGNOSIS — E559 Vitamin D deficiency, unspecified: Secondary | ICD-10-CM

## 2020-02-17 DIAGNOSIS — Z6841 Body Mass Index (BMI) 40.0 and over, adult: Secondary | ICD-10-CM

## 2020-02-17 DIAGNOSIS — R7303 Prediabetes: Secondary | ICD-10-CM | POA: Diagnosis not present

## 2020-02-17 DIAGNOSIS — R0683 Snoring: Secondary | ICD-10-CM

## 2020-02-18 ENCOUNTER — Encounter (INDEPENDENT_AMBULATORY_CARE_PROVIDER_SITE_OTHER): Payer: Self-pay | Admitting: Bariatrics

## 2020-02-18 NOTE — Progress Notes (Signed)
Chief Complaint:   OBESITY Colleen Webb is here to discuss her progress with her obesity treatment plan along with follow-up of her obesity related diagnoses. Colleen Webb is on the Category 4 Plan and states she is following her eating plan approximately 70% of the time. Colleen Webb states she is walking 30 minutes 2 times per week and taking the stairs more at work.  Today's visit was #: 3 Starting weight: 341 lbs Starting date: 01/13/2020 Today's weight: 343 lbs Today's date: 02/17/2020 Total lbs lost to date: 0 Total lbs lost since last in-office visit: 0  Interim History: Colleen Webb is up 4 lbs. She reports doing better with her water intake. She does note snacking more with her new job.  Subjective:   Vitamin D deficiency. Colleen Webb is taking Vitamin D supplementation. Last Vitamin D was 12.12 on 12/11/2019.  Prediabetes. Colleen Webb has a diagnosis of prediabetes based on her elevated HgA1c and was informed this puts her at greater risk of developing diabetes. She continues to work on diet and exercise to decrease her risk of diabetes. She denies nausea or hypoglycemia. Colleen Webb is taking metformin.  Lab Results  Component Value Date   HGBA1C 6.1 12/11/2019   Lab Results  Component Value Date   INSULIN 35.6 (H) 01/13/2020   Snoring. Colleen Webb states she has a sleep study scheduled.  Assessment/Plan:   Vitamin D deficiency. Low Vitamin D level contributes to fatigue and are associated with obesity, breast, and colon cancer. She agrees to continue to take Vitamin D and will follow-up for routine testing of Vitamin D, at least 2-3 times per year to avoid over-replacement.  Prediabetes. Colleen Webb will continue to work on weight loss, exercise, and decreasing simple carbohydrates to help decrease the risk of diabetes. She will continue metformin as directed.  Snoring. Colleen Webb will follow-up with the CPAP.  Class 3 severe obesity with serious comorbidity and body mass index (BMI) of 60.0 to 69.9 in adult,  unspecified obesity type (Colleen Webb).  Colleen Webb is currently in the action stage of change. As such, her goal is to continue with weight loss efforts. She has agreed to the Category 4 Plan.   She will work on meal planning and intentional eating.   Exercise goals: All adults should avoid inactivity. Some physical activity is better than none, and adults who participate in any amount of physical activity gain some health benefits.  Behavioral modification strategies: increasing lean protein intake, decreasing simple carbohydrates, increasing vegetables, increasing water intake, decreasing eating out, no skipping meals, meal planning and cooking strategies, keeping healthy foods in the home and planning for success.  Colleen Webb has agreed to follow-up with our clinic in 2-3 weeks. She was informed of the importance of frequent follow-up visits to maximize her success with intensive lifestyle modifications for her multiple health conditions.   Objective:   Pulse (!) 104, temperature 98.2 F (36.8 C), height 5\' 1"  (1.549 m), weight (!) 343 lb (155.6 kg), SpO2 94 %. Body mass index is 64.81 kg/m.  General: Cooperative, alert, well developed, in no acute distress. HEENT: Conjunctivae and lids unremarkable. Cardiovascular: Regular rhythm.  Lungs: Normal work of breathing. Neurologic: No focal deficits.   Lab Results  Component Value Date   CREATININE 0.65 01/13/2020   BUN 16 01/13/2020   NA 141 01/13/2020   K 4.6 01/13/2020   CL 103 01/13/2020   CO2 27 01/13/2020   Lab Results  Component Value Date   ALT 32 01/13/2020   AST 23 01/13/2020  ALKPHOS 97 01/13/2020   BILITOT <0.2 01/13/2020   Lab Results  Component Value Date   HGBA1C 6.1 12/11/2019   HGBA1C 6.0 01/29/2019   Lab Results  Component Value Date   INSULIN 35.6 (H) 01/13/2020   Lab Results  Component Value Date   TSH 2.470 01/13/2020   Lab Results  Component Value Date   CHOL 171 01/13/2020   HDL 63 01/13/2020   LDLCALC  98 01/13/2020   TRIG 52 01/13/2020   CHOLHDL 3 01/29/2019   Lab Results  Component Value Date   WBC 6.2 12/11/2019   HGB 12.5 12/11/2019   HCT 37.1 12/11/2019   MCV 81.7 12/11/2019   PLT 287.0 12/11/2019   Lab Results  Component Value Date   IRON 19 (L) 12/11/2019   FERRITIN 114.0 12/11/2019   Attestation Statements:   Reviewed by clinician on day of visit: allergies, medications, problem list, medical history, surgical history, family history, social history, and previous encounter notes.  Time spent on visit including pre-visit chart review and post-visit charting and care was 20 minutes.   Fernanda Drum, am acting as Energy manager for [employer], DO   I have reviewed the above documentation for accuracy and completeness, and I agree with the above. Corinna Capra, DO

## 2020-02-23 ENCOUNTER — Telehealth (INDEPENDENT_AMBULATORY_CARE_PROVIDER_SITE_OTHER): Payer: BC Managed Care – PPO | Admitting: Family Medicine

## 2020-02-23 ENCOUNTER — Encounter: Payer: Self-pay | Admitting: Family Medicine

## 2020-02-23 VITALS — BP 128/54 | Wt 344.0 lb

## 2020-02-23 DIAGNOSIS — R062 Wheezing: Secondary | ICD-10-CM | POA: Diagnosis not present

## 2020-02-23 DIAGNOSIS — R519 Headache, unspecified: Secondary | ICD-10-CM

## 2020-02-23 DIAGNOSIS — R0981 Nasal congestion: Secondary | ICD-10-CM

## 2020-02-23 MED ORDER — DOXYCYCLINE HYCLATE 100 MG PO TABS
100.0000 mg | ORAL_TABLET | Freq: Two times a day (BID) | ORAL | 0 refills | Status: DC
Start: 2020-02-23 — End: 2021-09-01

## 2020-02-23 MED ORDER — FLOVENT HFA 44 MCG/ACT IN AERO
2.0000 | INHALATION_SPRAY | Freq: Two times a day (BID) | RESPIRATORY_TRACT | 0 refills | Status: DC
Start: 2020-02-23 — End: 2020-03-25

## 2020-02-23 NOTE — Patient Instructions (Addendum)
-  I sent the medication(s) we discussed to your pharmacy: Meds ordered this encounter  Medications  . doxycycline (VIBRA-TABS) 100 MG tablet    Sig: Take 1 tablet (100 mg total) by mouth 2 (two) times daily.    Dispense:  20 tablet    Refill:  0  . fluticasone (FLOVENT HFA) 44 MCG/ACT inhaler    Sig: Inhale 2 puffs into the lungs in the morning and at bedtime.    Dispense:  1 Inhaler    Refill:  0    Please let us know if you have any questions or concerns regarding this prescription.  I hope you are feeling better soon! Seek care promptly if your symptoms worsen, new concerns arise or you are not improving with treatment.    WORK SLIP:  Patient Colleen Webb,  09-Aug-1996, was seen for a medical visit today 02/23/20. Please excuse from work today.  Sincerely: E-signature: Dr. Kriste Basque, DO [facility] Ph: [phone]

## 2020-02-23 NOTE — Telephone Encounter (Signed)
Spoke with the pt as she is scheduled for an appt today at 1:20pm.

## 2020-02-23 NOTE — Progress Notes (Signed)
Virtual Visit via Video Note  I connected with Colleen Webb  on 02/23/20 at  1:20 PM EDT by a video enabled telemedicine application and verified that I am speaking with the correct person using two identifiers.  Location patient: home, [place] Location provider:work or home office Persons participating in the virtual visit: patient, provider  I discussed the limitations of evaluation and management by telemedicine and the availability of in person appointments. The patient expressed understanding and agreed to proceed.   HPI:  Acute visit for several issues -all started a few months ago with chronic sinus issues but worse the last 2 weeks -symptoms:  sinus congestion, wheezing intermittently, headaches intermittently -headache described as sharp pain on R side of head more when sneezing or coughing only  -steaming with tea tree and peppermint the last 1 month - seems like it helps -has PMH "bronchitis"  sleep apnea, use of inhalers prn -she has been using her albuterol inhaler but does not seem to help much -she has had sinus infections in the past and thought this might be a sinus infection -she reports she has a neurologist whom she will be seeing in a few week, she also has an appointment with her PCP coming up   ROS: See pertinent positives and negatives per HPI.  Past Medical History:  Diagnosis Date  . Anxiety   . Back pain   . Chest pain   . Complaints of total body pain   . Depression   . Edema of both lower extremities   . Joint pain   . Low self esteem   . PCOS (polycystic ovarian syndrome)   . PCOS (polycystic ovarian syndrome)   . Sleep apnea   . Sleep disorder   . SOB (shortness of breath)   . Stomach ulcer   . Vitamin D deficiency     Past Surgical History:  Procedure Laterality Date  . TONSILLECTOMY      Family History  Problem Relation Age of Onset  . Diabetes Maternal Grandfather   . Diabetes Paternal Grandfather   . Thyroid disease Mother   . Sleep apnea  Mother   . Obesity Mother   . Diabetes Brother   . Diabetes Maternal Grandmother   . Healthy Brother   . Healthy Brother     SOCIAL HX: see hpi   Current Outpatient Medications:  .  albuterol (VENTOLIN HFA) 108 (90 Base) MCG/ACT inhaler, Inhale 1-2 puffs into the lungs every 6 (six) hours as needed for wheezing or shortness of breath., Disp: 18 g, Rfl: 1 .  fluticasone (FLONASE) 50 MCG/ACT nasal spray, Place 1 spray into both nostrils daily., Disp: 16 g, Rfl: 2 .  furosemide (LASIX) 20 MG tablet, Take 1 tablet (20 mg total) by mouth daily as needed for edema., Disp: 90 tablet, Rfl: 1 .  levonorgestrel-ethinyl estradiol (AVIANE) 0.1-20 MG-MCG tablet, Take 1 tablet by mouth daily., Disp: 84 tablet, Rfl: 3 .  metFORMIN (GLUCOPHAGE) 500 MG tablet, Take 2 tablets (1,000 mg total) by mouth 2 (two) times daily with a meal., Disp: 360 tablet, Rfl: 1 .  VITAMIN D PO, Take 50,000 Units by mouth once a week., Disp: , Rfl:  .  doxycycline (VIBRA-TABS) 100 MG tablet, Take 1 tablet (100 mg total) by mouth 2 (two) times daily., Disp: 20 tablet, Rfl: 0 .  fluticasone (FLOVENT HFA) 44 MCG/ACT inhaler, Inhale 2 puffs into the lungs in the morning and at bedtime., Disp: 1 Inhaler, Rfl: 0  EXAM:  VITALS per  patient if applicable:  GENERAL: alert, oriented, appears well and in no acute distress  HEENT: atraumatic, conjunttiva clear, no obvious abnormalities on inspection of external nose and ears  NECK: normal movements of the head and neck  LUNGS: on inspection no signs of respiratory distress, breathing rate appears normal, no obvious gross SOB, gasping or wheezing  CV: no obvious cyanosis  MS: moves all visible extremities without noticeable abnormality  PSYCH/NEURO: pleasant and cooperative, no obvious depression or anxiety, speech and thought processing grossly intact  ASSESSMENT AND PLAN:  Discussed the following assessment and plan:  Sinus congestion  Wheezing  Nonintractable  headache, unspecified chronicity pattern, unspecified headache type  -we discussed possible serious and likely etiologies, options for evaluation and workup, limitations of telemedicine visit vs in person visit, treatment, treatment risks and precautions. Pt prefers to treat via telemedicine empirically rather then risking or undertaking an in person visit at this moment. Possible sinusitis with bronchitis vs other, possibly arising from uncontrolled baseline allergies and possible undiagnosed asthma. She also suffers from OSA and morbid obesity, further complicating the situation. She opted for trial treatment with doxy 100mg  bid x 7-10 days and addition of inhaled steroid rather than oral steroid given unsure of dx at this point. Discussed other causes headaches and wheezing and advised would need prompt in-person evaluation and further work up if not resolving with this treatment and follow up with PCP. She reports she has upcoming appts with PCP and neurologist, but agrees to seek  in person care sooner/promptly if worsening, new symptoms arise, or if is not improving with treatment. Work note for today provided per her request in pt instructions.   I discussed the assessment and treatment plan with the patient. The patient was provided an opportunity to ask questions and all were answered. The patient agreed with the plan and demonstrated an understanding of the instructions.   The patient was advised to call back or seek an in-person evaluation if the symptoms worsen or if the condition fails to improve as anticipated.   , DO

## 2020-03-03 ENCOUNTER — Telehealth: Payer: Self-pay | Admitting: *Deleted

## 2020-03-03 NOTE — Telephone Encounter (Signed)
Patient spoke with nurse triage. Patient reports she wants an appointment before the 15th. She is having wheezing and migraines. She is on antibiotics and hasn't noticed any difference.

## 2020-03-03 NOTE — Telephone Encounter (Signed)
Patient called today to schedule an appointment for wheezing and gets migraines when she coughs or sneezes.   She seen Dr. Selena Batten virtually on 02/23/2020 and was prescribed an antibiotic and she has completed the antibiotics and still not better.   I was going to schedule her another virtual visit but she said that she would just call back after she looks at her schedule.

## 2020-03-03 NOTE — Telephone Encounter (Signed)
Left a message for the pt to return my call as there is an opening for a virtual visit tomorrow and to call back and speak with me.

## 2020-03-03 NOTE — Telephone Encounter (Signed)
Spoke with the pt and she stated she would not be able to attend the visit for tomorrow and will await the visit on 7/15.  Message sent to PCP.

## 2020-03-04 NOTE — Telephone Encounter (Signed)
Left a message for the pt to return my call.  

## 2020-03-04 NOTE — Telephone Encounter (Signed)
I am happy to add her as a virtual visit at end of day today if that works better for her or could do a virtual with her tomorrow as I am on call. She would have to be flexible with timing for that visit if we do it tomorrow since I will be seeing patients in office, but I could send her a link once I'm available to log in and should be able to complete that at some time in morning.

## 2020-03-09 NOTE — Telephone Encounter (Signed)
Patient has appointment scheduled for 03/10/2020.

## 2020-03-10 ENCOUNTER — Telehealth (INDEPENDENT_AMBULATORY_CARE_PROVIDER_SITE_OTHER): Payer: BC Managed Care – PPO | Admitting: Family Medicine

## 2020-03-10 ENCOUNTER — Other Ambulatory Visit: Payer: Self-pay

## 2020-03-10 DIAGNOSIS — J0100 Acute maxillary sinusitis, unspecified: Secondary | ICD-10-CM | POA: Diagnosis not present

## 2020-03-10 DIAGNOSIS — J4520 Mild intermittent asthma, uncomplicated: Secondary | ICD-10-CM

## 2020-03-10 NOTE — Progress Notes (Signed)
Virtual Visit via Video Note  I connected with Colleen Webb  on 03/10/20 at  2:00 PM EDT by a video enabled telemedicine application and verified that I am speaking with the correct person using two identifiers.  Location patient: home Location provider:work or home office Persons participating in the virtual visit: patient, provider  I discussed the limitations of evaluation and management by telemedicine and the availability of in person appointments. The patient expressed understanding and agreed to proceed.   Colleen Webb DOB: 1996/07/01 Encounter date: 03/10/2020  This is a 24 y.o. female who presents with No chief complaint on file.   History of present illness: Cough just started a few months ago. Has been sick x 2 months. [facility] because she was getting some tightness in chest after seeing Dr. Selena Batten.   She did have follow up with Dr. Selena Batten virtually and was given steroid inhaler and antibiotic.   Started with wheezing initially. Could hear self whistling with breathing and then other people started hearing it as well. Wasn't out of breath; just happened with sitting. Then started with migraine - felt like someone was kicking her in head if she sneezed/coughed. Would last for 5-7 minutes. Then started with chest tightening.   Currently she is taking flovent inhaler 2 puffs BID and doxycycline 100mg  daily.   Does still have some pressure/pain in sinuses. Bought humidifier to try and help.   meds seem to be working now. When she coughs she is not getting mucous up.   Doesn't feel like she is wheezing any longer.   Last fever was Monday night 99.8. really fatigued that day. Did go to work but slept from time she was home until next day.   Took benadryl a couple of times, but made her drowsy. She was told by urgent care to take allergy medication.   No ear pain. But did find self tugging on ear. But this is better. She is using flonase as well still.   No Known  Allergies No outpatient medications have been marked as taking for the 03/10/20 encounter (Video Visit) with 03/12/20, MD.    Review of Systems  Constitutional: Negative for chills and fever.  HENT: Positive for congestion, postnasal drip, sinus pressure, sinus pain and sore throat. Negative for ear pain.   Respiratory: Positive for cough (improved). Negative for shortness of breath and wheezing.   Cardiovascular: Negative for chest pain.  Genitourinary: Negative for vaginal bleeding, vaginal discharge and vaginal pain.    Objective:  There were no vitals taken for this visit.      BP Readings from Last 3 Encounters:  02/23/20 (!) 128/54  02/17/20 124/84  01/27/20 121/73   Wt Readings from Last 3 Encounters:  02/23/20 (!) 344 lb (156 kg)  02/17/20 (!) 343 lb (155.6 kg)  01/27/20 (!) 339 lb (153.8 kg)    EXAM:  GENERAL: alert, oriented, appears well and in no acute distress  HEENT: atraumatic, conjunctiva clear, no obvious abnormalities on inspection of external nose and ears  NECK: normal movements of the head and neck  LUNGS: on inspection no signs of respiratory distress, breathing rate appears normal, no obvious gross SOB, gasping or wheezing  CV: no obvious cyanosis  MS: moves all visible extremities without noticeable abnormality  PSYCH/NEURO: pleasant and cooperative, no obvious depression or anxiety, speech and thought processing grossly intact   Assessment/Plan  1. Acute maxillary sinusitis, recurrence not specified Improving. Wasn't taking doxy properly. Advised BID dosing  for completion. Suggested restart flonase. Mucinex prn congestion/pressure. Let me know if any worsening of sx.  2. Mild intermittent asthma without complication Breathing better overall, cough better. Continue with flovent 2 puffs BID. Will set up follow up for her for once better. Did advise ok to decrease inhaler to 1 puff BID when feeling like she has recovered from current  resp/sinus sx and until follow up appointment.    Return for reschedule for repeat vaginal testing. let me know if any worsening sinus sx. She will have follow up next week with weight loss clinic. She is motivated to work on this. I did advise for her to ask about recheck A1C and consider COVID ab testing.   I discussed the assessment and treatment plan with the patient. The patient was provided an opportunity to ask questions and all were answered. The patient agreed with the plan and demonstrated an understanding of the instructions.   The patient was advised to call back or seek an in-person evaluation if the symptoms worsen or if the condition fails to improve as anticipated.  I provided 20 minutes of non-face-to-face time during this encounter.   Theodis Shove, MD

## 2020-03-11 ENCOUNTER — Telehealth: Payer: Self-pay | Admitting: *Deleted

## 2020-03-11 NOTE — Telephone Encounter (Signed)
Left a detailed message at the pts cell number to schedule a follow up appt as below. 

## 2020-03-11 NOTE — Telephone Encounter (Signed)
-----   Message from Wynn Banker, MD sent at 03/10/2020  3:10 PM EDT ----- Please set up appointment in next few months (recheck chlamydia/speculum exam)

## 2020-03-17 ENCOUNTER — Encounter (INDEPENDENT_AMBULATORY_CARE_PROVIDER_SITE_OTHER): Payer: Self-pay | Admitting: Bariatrics

## 2020-03-17 ENCOUNTER — Ambulatory Visit (INDEPENDENT_AMBULATORY_CARE_PROVIDER_SITE_OTHER): Payer: BC Managed Care – PPO | Admitting: Bariatrics

## 2020-03-17 ENCOUNTER — Other Ambulatory Visit: Payer: Self-pay

## 2020-03-17 VITALS — BP 123/72 | HR 110 | Temp 97.8°F | Ht 61.0 in | Wt 346.0 lb

## 2020-03-17 DIAGNOSIS — J069 Acute upper respiratory infection, unspecified: Secondary | ICD-10-CM

## 2020-03-17 DIAGNOSIS — Z9189 Other specified personal risk factors, not elsewhere classified: Secondary | ICD-10-CM | POA: Diagnosis not present

## 2020-03-17 DIAGNOSIS — E559 Vitamin D deficiency, unspecified: Secondary | ICD-10-CM | POA: Diagnosis not present

## 2020-03-17 DIAGNOSIS — R7303 Prediabetes: Secondary | ICD-10-CM

## 2020-03-17 DIAGNOSIS — G4739 Other sleep apnea: Secondary | ICD-10-CM

## 2020-03-17 DIAGNOSIS — Z6841 Body Mass Index (BMI) 40.0 and over, adult: Secondary | ICD-10-CM

## 2020-03-21 ENCOUNTER — Ambulatory Visit: Payer: BC Managed Care – PPO | Admitting: Family Medicine

## 2020-03-21 ENCOUNTER — Encounter (INDEPENDENT_AMBULATORY_CARE_PROVIDER_SITE_OTHER): Payer: Self-pay | Admitting: Bariatrics

## 2020-03-21 NOTE — Progress Notes (Signed)
Chief Complaint:   OBESITY Colleen Webb is here to discuss her progress with her obesity treatment plan along with follow-up of her obesity related diagnoses. Colleen Webb is on the Category 4 Plan and states she is following her eating plan approximately 30% of the time. Colleen Webb states she is walking 15 minutes 1 time per week.  Today's visit was #: 4 Starting weight: 341 lbs Starting date: 01/13/2020 Today's weight: 346 lbs Today's date: 03/17/2020 Total lbs lost to date: 0 Total lbs lost since last in-office visit: 0  Interim History: Colleen Webb is up 3 lbs. She is up 2-3 lbs of water.  Subjective:   Prediabetes. Colleen Webb has a diagnosis of prediabetes based on her elevated HgA1c and was informed this puts her at greater risk of developing diabetes. She continues to work on diet and exercise to decrease her risk of diabetes. She denies nausea or hypoglycemia. Colleen Webb is taking Glucophage.  Lab Results  Component Value Date   HGBA1C 6.1 12/11/2019   Lab Results  Component Value Date   INSULIN 35.6 (H) 01/13/2020   Other sleep apnea. Colleen Webb needs evaluation and has appointment scheduled for sleep study.  Upper respiratory tract infection, unspecified type. Colleen Webb reports URI symptoms 2 weeks ago and symptoms have resolved. She wants to have a COVID antibody test.  Vitamin D deficiency. Colleen Webb reports having been on Vitamin D in the past.   Ref. Range 12/11/2019 14:54  VITD Latest Ref Range: 30.00 - 100.00 ng/mL 12.12 (L)   At risk for activity intolerance. Colleen Webb is at risk of exercise intolerance due to obesity and possible sleep apnea.  Assessment/Plan:   Prediabetes. Colleen Webb will continue to work on weight loss, exercise, and decreasing simple carbohydrates to help decrease the risk of diabetes. She will continue Glucophage as directed. Hemoglobin A1c will be checked today.  Other sleep apnea. Intensive lifestyle modifications are the first line treatment for this issue. We discussed  several lifestyle modifications today and she will continue to work on diet, exercise and weight loss efforts. We will continue to monitor. Orders and follow up as documented in patient record. Colleen Webb will have sleep study as scheduled.  Counseling  Sleep apnea is a condition in which breathing pauses or becomes shallow during sleep. This happens over and over during the night. This disrupts your sleep and keeps your body from getting the rest that it needs, which can cause tiredness and lack of energy (fatigue) during the day.  Sleep apnea treatment: If you were given a device to open your airway while you sleep, USE IT!  Sleep hygiene:   Limit or avoid alcohol, caffeinated beverages, and cigarettes, especially close to bedtime.   Do not eat a large meal or eat spicy foods right before bedtime. This can lead to digestive discomfort that can make it hard for you to sleep.  Keep a sleep diary to help you and your health care provider figure out what could be causing your insomnia.  . Make your bedroom a dark, comfortable place where it is easy to fall asleep. ? Put up shades or blackout curtains to block light from outside. ? Use a white noise machine to block noise. ? Keep the temperature cool. . Limit screen use before bedtime. This includes: ? Watching TV. ? Using your smartphone, tablet, or computer. . Stick to a routine that includes going to bed and waking up at the same times every day and night. This can help you fall asleep faster. Consider  making a quiet activity, such as reading, part of your nighttime routine. . Try to avoid taking naps during the day so that you sleep better at night. . Get out of bed if you are still awake after 15 minutes of trying to sleep. Keep the lights down, but try reading or doing a quiet activity. When you feel sleepy, go back to bed.  Upper respiratory tract infection, unspecified type. SAR CoV2 Serology (COVID 19)AB(IGG)IA antibody test will be  checked today.  Vitamin D deficiency. Low Vitamin D level contributes to fatigue and are associated with obesity, breast, and colon cancer. VITAMIN D 25 Hydroxy (Vit-D Deficiency, Fractures) level will be checked today.  At risk for activity intolerance. Colleen Webb was given approximately 15 minutes of exercise intolerance counseling today. She is 24 y.o. female and has risk factors exercise intolerance including obesity. We discussed intensive lifestyle modifications today with an emphasis on specific weight loss instructions and strategies. Colleen Webb will slowly increase activity as tolerated.  Repetitive spaced learning was employed today to elicit superior memory formation and behavioral change.  Class 3 severe obesity with serious comorbidity and body mass index (BMI) of 60.0 to 69.9 in adult, unspecified obesity type (HCC).  Colleen Webb is currently in the action stage of change. As such, her goal is to continue with weight loss efforts. She has agreed to the Category 4 Plan.   She will work on meal planning, intentional eating, and increasing her water and protein intake. She will be more adherent to the plan.  Exercise goals: All adults should avoid inactivity. Some physical activity is better than none, and adults who participate in any amount of physical activity gain some health benefits.  Behavioral modification strategies: increasing lean protein intake, decreasing simple carbohydrates, increasing vegetables, increasing water intake, decreasing eating out, no skipping meals, meal planning and cooking strategies, keeping healthy foods in the home and planning for success.  Colleen Webb has agreed to follow-up with our clinic fasting in 2-3 weeks. She was informed of the importance of frequent follow-up visits to maximize her success with intensive lifestyle modifications for her multiple health conditions.   Colleen Webb was informed we would discuss her lab results at her next visit unless there is a critical  issue that needs to be addressed sooner. Colleen Webb agreed to keep her next visit at the agreed upon time to discuss these results.  Objective:   Blood pressure 123/72, pulse (!) 110, temperature 97.8 F (36.6 C), height 5\' 1"  (1.549 m), weight (!) 346 lb (156.9 kg), SpO2 98 %. Body mass index is 65.38 kg/m.  General: Cooperative, alert, well developed, in no acute distress. HEENT: Conjunctivae and lids unremarkable. Cardiovascular: Regular rhythm.  Lungs: Normal work of breathing. Neurologic: No focal deficits.   Lab Results  Component Value Date   CREATININE 0.65 01/13/2020   BUN 16 01/13/2020   NA 141 01/13/2020   K 4.6 01/13/2020   CL 103 01/13/2020   CO2 27 01/13/2020   Lab Results  Component Value Date   ALT 32 01/13/2020   AST 23 01/13/2020   ALKPHOS 97 01/13/2020   BILITOT <0.2 01/13/2020   Lab Results  Component Value Date   HGBA1C 6.1 12/11/2019   HGBA1C 6.0 01/29/2019   Lab Results  Component Value Date   INSULIN 35.6 (H) 01/13/2020   Lab Results  Component Value Date   TSH 2.470 01/13/2020   Lab Results  Component Value Date   CHOL 171 01/13/2020   HDL 63 01/13/2020  LDLCALC 98 01/13/2020   TRIG 52 01/13/2020   CHOLHDL 3 01/29/2019   Lab Results  Component Value Date   WBC 6.2 12/11/2019   HGB 12.5 12/11/2019   HCT 37.1 12/11/2019   MCV 81.7 12/11/2019   PLT 287.0 12/11/2019   Lab Results  Component Value Date   IRON 19 (L) 12/11/2019   FERRITIN 114.0 12/11/2019   Attestation Statements:   Reviewed by clinician on day of visit: allergies, medications, problem list, medical history, surgical history, family history, social history, and previous encounter notes.  Fernanda Drum, am acting as Energy manager for [employer], DO   I have reviewed the above documentation for accuracy and completeness, and I agree with the above. Corinna Capra, DO

## 2020-03-22 ENCOUNTER — Institutional Professional Consult (permissible substitution): Payer: Self-pay | Admitting: Neurology

## 2020-03-24 ENCOUNTER — Other Ambulatory Visit: Payer: Self-pay | Admitting: Family Medicine

## 2020-03-29 ENCOUNTER — Encounter: Payer: Self-pay | Admitting: Neurology

## 2020-03-29 ENCOUNTER — Institutional Professional Consult (permissible substitution): Payer: Self-pay | Admitting: Neurology

## 2020-03-29 NOTE — Telephone Encounter (Signed)
Pt did not show for their appt with Dr. Vickey Huger today.  This is pt's second new patient no-show and per [facility] policy is eligible for dismissal.

## 2020-03-29 NOTE — Telephone Encounter (Signed)
Please dismiss. CD 

## 2020-03-30 ENCOUNTER — Encounter: Payer: Self-pay | Admitting: Neurology

## 2020-04-21 ENCOUNTER — Ambulatory Visit (INDEPENDENT_AMBULATORY_CARE_PROVIDER_SITE_OTHER): Payer: BC Managed Care – PPO | Admitting: Bariatrics

## 2020-04-25 ENCOUNTER — Encounter: Payer: Self-pay | Admitting: Family Medicine

## 2020-04-29 NOTE — Telephone Encounter (Signed)
Colleen Webb - where can we send her for sleep eval? She has no showed multiple groups (see referrals). I am ok with placing another, but not sure where to send her. She asked for [facility] or [facility]. Thanks!

## 2020-05-18 ENCOUNTER — Encounter: Payer: Self-pay | Admitting: Family Medicine

## 2020-05-19 NOTE — Telephone Encounter (Signed)
I think she should have an in office visit/exam due to length of symptoms.

## 2020-05-19 NOTE — Telephone Encounter (Signed)
Message forwarded to PCP as there are no openings available at this office today or tomorrow.

## 2020-06-15 ENCOUNTER — Ambulatory Visit: Payer: BC Managed Care – PPO | Admitting: Family Medicine

## 2020-06-17 ENCOUNTER — Ambulatory Visit: Payer: BC Managed Care – PPO | Admitting: Family Medicine

## 2020-07-24 ENCOUNTER — Encounter: Payer: Self-pay | Admitting: Family Medicine

## 2020-07-29 ENCOUNTER — Ambulatory Visit: Payer: BC Managed Care – PPO | Admitting: Family Medicine

## 2020-09-09 ENCOUNTER — Ambulatory Visit: Payer: BC Managed Care – PPO | Admitting: Family Medicine

## 2020-09-09 NOTE — Progress Notes (Deleted)
  Colleen Webb DOB: 30-May-1996 Encounter date: 09/09/2020  This is a 25 y.o. female who presents with No chief complaint on file.   History of present illness:  HPI   No Known Allergies No outpatient medications have been marked as taking for the 09/09/20 encounter (Appointment) with Wynn Banker, MD.    Review of Systems  Objective:  There were no vitals taken for this visit.      BP Readings from Last 3 Encounters:  03/17/20 123/72  02/23/20 (!) 128/54  02/17/20 124/84   Wt Readings from Last 3 Encounters:  03/17/20 (!) 346 lb (156.9 kg)  02/23/20 (!) 344 lb (156 kg)  02/17/20 (!) 343 lb (155.6 kg)    Physical Exam  Assessment/Plan  There are no diagnoses linked to this encounter.       Theodis Shove, MD

## 2020-10-14 ENCOUNTER — Ambulatory Visit: Payer: BC Managed Care – PPO | Admitting: Family Medicine

## 2020-11-18 ENCOUNTER — Ambulatory Visit: Payer: BC Managed Care – PPO | Admitting: Family Medicine

## 2020-12-28 ENCOUNTER — Ambulatory Visit (INDEPENDENT_AMBULATORY_CARE_PROVIDER_SITE_OTHER): Payer: BC Managed Care – PPO | Admitting: Obstetrics & Gynecology

## 2020-12-28 ENCOUNTER — Other Ambulatory Visit: Payer: Self-pay

## 2020-12-28 ENCOUNTER — Other Ambulatory Visit (HOSPITAL_COMMUNITY)
Admission: RE | Admit: 2020-12-28 | Discharge: 2020-12-28 | Disposition: A | Discharge status: Home / Self Care | Payer: BC Managed Care – PPO | Source: Ambulatory Visit | Attending: Obstetrics & Gynecology | Admitting: Obstetrics & Gynecology

## 2020-12-28 ENCOUNTER — Encounter: Payer: Self-pay | Admitting: Obstetrics & Gynecology

## 2020-12-28 VITALS — BP 126/79 | HR 107 | Ht 61.0 in | Wt 373.1 lb

## 2020-12-28 DIAGNOSIS — E282 Polycystic ovarian syndrome: Secondary | ICD-10-CM | POA: Diagnosis not present

## 2020-12-28 DIAGNOSIS — Z113 Encounter for screening for infections with a predominantly sexual mode of transmission: Secondary | ICD-10-CM

## 2020-12-28 DIAGNOSIS — Z30011 Encounter for initial prescription of contraceptive pills: Secondary | ICD-10-CM

## 2020-12-28 MED ORDER — LEVONORGESTREL-ETHINYL ESTRAD 0.15-30 MG-MCG PO TABS: 1.0000 | ORAL_TABLET | Freq: Every day | ORAL | 11 refills | Status: DC

## 2020-12-28 NOTE — Progress Notes (Signed)
na

## 2020-12-28 NOTE — Patient Instructions (Signed)
Oral Contraception Information Oral contraceptive pills (OCPs) are medicines taken by mouth to prevent pregnancy. They work by:  Preventing the ovaries from releasing eggs.  Thickening mucus in the lower part of the uterus (cervix). This prevents sperm from entering the uterus.  Thinning the lining of the uterus (endometrium). This prevents a fertilized egg from attaching to the endometrium. OCPs are highly effective when taken exactly as prescribed. However, OCPs do not prevent STIs (sexually transmitted infections). Using condoms while on an OCP can help prevent STIs. What happens before starting OCPs? Before you start taking OCPs:  You may have a physical exam, blood test, and Pap test.  Your health care provider will make sure you are a good candidate for oral contraception. OCPs are not a good option for certain women, such as: ? Women who smoke and are older than age 35. ? Women who have or have had certain conditions, such as:  A history of high blood pressure.  Deep vein thrombosis.  Pulmonary embolism.  Stroke.  Cardiovascular disease.  Peripheral vascular disease. Ask your health care provider about the possible side effects of the OCP you may be prescribed. Be aware that it can take 2-3 months for your body to adjust to changes in hormone levels. Types of oral contraception Birth control pills contain the hormones estrogen and progestin (synthetic progesterone) or progestin only. The combination pill This type of pill contains estrogen and progestin hormones.  Conventional contraception pills come in packs of 21 or 28 pills. ? Some packs with 28-day pills contain estrogen and progestin for the first 21-24 days. Hormone-free tablets, called placebos, are taken for the final 4-7 days. You should have menstrual bleeding during the time you take the placebos. ? In packs with 21 tablets, you take no pills for 7 days. Menstrual bleeding occurs during these days. (Some people  prefer taking a pill for 28 days to help establish a routine).  Extended-interval contraception pills come in packs of 91 pills. The first 84 tablets have both estrogen and progestin. The last 7 pills are placebos. Menstrual bleeding occurs during the placebo days. With this schedule, menstrual bleeding happens once every 3 months.  Continuous contraception pills come in packs of 28 pills. All pills in the pack contain estrogen and progestin. With this schedule, regular menstrual bleeding does not happen, but there may be spotting or irregular bleeding. Progestin-only pills This type of pill is often called the mini-pill and contains the progestin hormone only. It comes in packs of 28 pills. In some packs, the last 4 pills are placebos. The pill must be taken at the same time every day. This is very important to prevent pregnancy. Menstrual bleeding may not be regular or predictable.   What are the advantages? Oral contraception provides reliable and continuous contraception if taken as directed. It may treat or decrease symptoms of:  Menstrual period cramps.  Irregular menstrual cycle or bleeding.  Heavy menstrual flow.  Abnormal uterine bleeding.  Acne, depending on the type of pill.  Polycystic ovarian syndrome (POS).  Endometriosis.  Iron deficiency anemia.  Premenstrual symptoms, including severe irritability, depression, or anxiety. It also may:  Reduce the risk of endometrial and ovarian cancer.  Be used as emergency contraception.  Prevent ectopic pregnancies and infections of the fallopian tubes. What can make OCPs less effective? OCPs may be less effective if:  You forget to take the pill every day. For progestin-only pills, it is especially important to take the pill at the   same time each day. Even taking it 3 hours late can increase the risk of pregnancy.  You have a stomach or intestinal disease that reduces your body's ability to absorb the pill.  You take OCPs  with other medicines that make OCPs less effective, such as antibiotics, certain HIV medicines, and some seizure medicines.  You take expired OCPs.  You forget to restart the pill after 7 days of not taking it. This refers to the packs of 21 pills. What are the side effects and risks? OCPs can sometimes cause side effects, such as:  Headache.  Depression.  Trouble sleeping.  Nausea and vomiting.  Breast tenderness.  Irregular bleeding or spotting during the first several months.  Bloating or fluid retention.  Increase in blood pressure. Combination pills may slightly increase the risk of:  Blood clots.  Heart attack.  Stroke. Follow these instructions at home: Follow instructions from your health care provider about how to start taking your first cycle of OCPs. Depending on when you start the pill, you may need to use a backup form of birth control, such as condoms, during the first week. Make sure you know what steps to take if you forget to take the pill. Summary  Oral contraceptive pills (OCPs) are medicines taken by mouth to prevent pregnancy. They are highly effective when taken exactly as prescribed.  OCPs contain a combination of the hormones estrogen and progestin (synthetic progesterone) or progestin only.  Before you start taking the pill, you may have a physical exam, blood test, and Pap test. Your health care provider will make sure you are a good candidate for oral contraception.  The combination pill may come in a 21-day pack, a 28-day pack, or a 91-day pack. Progestin-only pills come in packs of 28 pills.  OCPs can sometimes cause side effects, such as headache, nausea, breast tenderness, or irregular bleeding. This information is not intended to replace advice given to you by your health care provider. Make sure you discuss any questions you have with your health care provider. Document Revised: 05/13/2020 Document Reviewed: 04/21/2020 Elsevier Patient  Education  2021 [employer] Inc.  

## 2020-12-28 NOTE — Progress Notes (Signed)
Patient ID: Shelisa Dixon-Diop, female   DOB: [birth year], 25 y.o.   MRN: [id number]  Chief Complaint  Patient presents with  . Gynecologic Exam  f/u PCOS  HPI Kissa Dixon-Diop is a 25 y.o. female.  [id number] Patient's last menstrual period was 03/13/2020. She has a diagnosis of PCOS for many years and had cycle control with OCP until 03/2020. No bleeding since 02/2020, negative pregnancy tests and she has been abstinent since 08/2020. Metformin was stopped due to side-effects. Her insulin level and A1c were elevated when last checked. HPI  Past Medical History:  Diagnosis Date  . Anxiety   . Back pain   . Chest pain   . Complaints of total body pain   . Depression   . Edema of both lower extremities   . Joint pain   . Low self esteem   . PCOS (polycystic ovarian syndrome)   . PCOS (polycystic ovarian syndrome)   . Sleep apnea   . Sleep disorder   . SOB (shortness of breath)   . Stomach ulcer   . Vitamin D deficiency     Past Surgical History:  Procedure Laterality Date  . TONSILLECTOMY      Family History  Problem Relation Age of Onset  . Diabetes Maternal Grandfather   . Diabetes Paternal Grandfather   . Thyroid disease Mother   . Sleep apnea Mother   . Obesity Mother   . Diabetes Brother   . Diabetes Maternal Grandmother   . Healthy Brother   . Healthy Brother     Social History Social History   Tobacco Use  . Smoking status: Never Smoker  . Smokeless tobacco: Never Used  Vaping Use  . Vaping Use: Never used  Substance Use Topics  . Alcohol use: Yes    Alcohol/week: 0.0 standard drinks    Comment: occasional  . Drug use: No    No Known Allergies  Current Outpatient Medications  Medication Sig Dispense Refill  . albuterol (VENTOLIN HFA) 108 (90 Base) MCG/ACT inhaler Inhale 1-2 puffs into the lungs every 6 (six) hours as needed for wheezing or shortness of breath. 18 g 1  . FLOVENT HFA 44 MCG/ACT inhaler INHALE 2 PUFFS INTO THE LUNGS IN THE MORNING AND AT  BEDTIME 10.6 g 5  . fluticasone (FLONASE) 50 MCG/ACT nasal spray Place 1 spray into both nostrils daily. 16 g 2  . furosemide (LASIX) 20 MG tablet Take 1 tablet (20 mg total) by mouth daily as needed for edema. 90 tablet 1  . levonorgestrel-ethinyl estradiol (NORDETTE) 0.15-30 MG-MCG tablet Take 1 tablet by mouth daily. 28 tablet 11  . metFORMIN (GLUCOPHAGE) 500 MG tablet Take 2 tablets (1,000 mg total) by mouth 2 (two) times daily with a meal. 360 tablet 1  . doxycycline (VIBRA-TABS) 100 MG tablet Take 1 tablet (100 mg total) by mouth 2 (two) times daily. 20 tablet 0  . VITAMIN D PO Take 50,000 Units by mouth once a week.     No current facility-administered medications for this visit.    Review of Systems Review of Systems  Constitutional: Negative.   Gastrointestinal: Negative.   Genitourinary: Positive for menstrual problem. Negative for pelvic pain, vaginal bleeding and vaginal discharge.    Blood pressure 126/79, pulse (!) 107, height 5\' 1"  (1.549 m), weight (!) 373 lb 1.6 oz (169.2 kg), last menstrual period 03/13/2020.  Physical Exam Physical Exam Constitutional:      Appearance: She is obese. She is not ill-appearing.  Pulmonary:  Effort: Pulmonary effort is normal.  Genitourinary:    General: Normal vulva.  Neurological:     Mental Status: She is alert.  Psychiatric:        Mood and Affect: Mood normal.        Behavior: Behavior normal.     Data Reviewed Lab results, office notes  Assessment PCOS, patient wants cycle control and does not want to conceive Screening for STD (sexually transmitted disease) - Plan: Cervicovaginal ancillary only( [place]), HIV Antibody (routine testing w rflx), RPR, Hepatitis B Surface AntiGEN, Hepatitis C Antibody  PCOS (polycystic ovarian syndrome) - Plan: levonorgestrel-ethinyl estradiol (NORDETTE) 0.15-30 MG-MCG tablet  Oral contraception initial prescription - Plan: levonorgestrel-ethinyl estradiol (NORDETTE) 0.15-30  MG-MCG tablet    Plan Cycle control with OCP Should consider metformin or similar for prediabetes STD TOC today Pap reviewed and f/u can be repeated in 2 years ASCUS neg HR HPV 11/2019    Scheryl Darter 12/28/2020, 4:35 PM

## 2020-12-29 LAB — CERVICOVAGINAL ANCILLARY ONLY
Bacterial Vaginitis (gardnerella): POSITIVE — AB
Candida Glabrata: NEGATIVE
Candida Vaginitis: NEGATIVE
Chlamydia: NEGATIVE
Comment: NEGATIVE
Comment: NEGATIVE
Comment: NEGATIVE
Comment: NEGATIVE
Comment: NEGATIVE
Comment: NORMAL
Neisseria Gonorrhea: NEGATIVE
Trichomonas: NEGATIVE

## 2020-12-29 LAB — HEPATITIS B SURFACE ANTIGEN: Hepatitis B Surface Ag: NEGATIVE

## 2020-12-29 LAB — HEPATITIS C ANTIBODY: Hep C Virus Ab: <0.1 s/co ratio (ref 0.0–0.9)

## 2020-12-29 LAB — RPR: RPR Ser Ql: NONREACTIVE

## 2020-12-29 LAB — HIV ANTIBODY (ROUTINE TESTING W REFLEX): HIV Screen 4th Generation wRfx: NONREACTIVE

## 2020-12-30 ENCOUNTER — Other Ambulatory Visit: Payer: Self-pay | Admitting: Lactation Services

## 2020-12-30 MED ORDER — METRONIDAZOLE 500 MG PO TABS
500.0000 mg | ORAL_TABLET | Freq: Two times a day (BID) | ORAL | 0 refills | Status: DC
Start: 2020-12-30 — End: 2021-09-01

## 2020-12-30 NOTE — Progress Notes (Signed)
Flagyl sent to pharmacy per standing order for + BV.

## 2021-01-07 ENCOUNTER — Encounter: Payer: Self-pay | Admitting: Family Medicine

## 2021-01-07 DIAGNOSIS — G4730 Sleep apnea, unspecified: Secondary | ICD-10-CM

## 2021-01-07 DIAGNOSIS — E282 Polycystic ovarian syndrome: Secondary | ICD-10-CM

## 2021-01-07 DIAGNOSIS — Z6841 Body Mass Index (BMI) 40.0 and over, adult: Secondary | ICD-10-CM

## 2021-03-10 ENCOUNTER — Encounter: Payer: BC Managed Care – PPO | Admitting: Family Medicine

## 2021-04-07 ENCOUNTER — Ambulatory Visit: Payer: BC Managed Care – PPO | Admitting: Family Medicine

## 2021-07-10 ENCOUNTER — Encounter: Payer: Self-pay | Admitting: Family Medicine

## 2021-08-11 ENCOUNTER — Ambulatory Visit: Payer: BC Managed Care – PPO | Admitting: Family Medicine

## 2021-09-01 ENCOUNTER — Ambulatory Visit (INDEPENDENT_AMBULATORY_CARE_PROVIDER_SITE_OTHER): Payer: Medicaid Other | Admitting: Family Medicine

## 2021-09-01 ENCOUNTER — Encounter: Payer: Self-pay | Admitting: Family Medicine

## 2021-09-01 VITALS — BP 112/70 | HR 112 | Temp 98.1°F | Ht 61.0 in | Wt 381.7 lb

## 2021-09-01 DIAGNOSIS — G4733 Obstructive sleep apnea (adult) (pediatric): Secondary | ICD-10-CM

## 2021-09-01 DIAGNOSIS — R197 Diarrhea, unspecified: Secondary | ICD-10-CM

## 2021-09-01 DIAGNOSIS — R7301 Impaired fasting glucose: Secondary | ICD-10-CM

## 2021-09-01 DIAGNOSIS — N912 Amenorrhea, unspecified: Secondary | ICD-10-CM

## 2021-09-01 DIAGNOSIS — E282 Polycystic ovarian syndrome: Secondary | ICD-10-CM

## 2021-09-01 DIAGNOSIS — Z13220 Encounter for screening for lipoid disorders: Secondary | ICD-10-CM

## 2021-09-01 DIAGNOSIS — R600 Localized edema: Secondary | ICD-10-CM

## 2021-09-01 MED ORDER — DESOGESTREL-ETHINYL ESTRADIOL 0.15-0.02/0.01 MG (21/5) PO TABS: 1.0000 | ORAL_TABLET | Freq: Every day | ORAL | 1 refills | Status: AC

## 2021-09-01 MED ORDER — FUROSEMIDE 20 MG PO TABS: 20.0000 mg | ORAL_TABLET | Freq: Every day | ORAL | 1 refills | Status: AC | PRN

## 2021-09-01 NOTE — Progress Notes (Signed)
Colleen Webb DOB: 1995-10-23 Encounter date: 09/01/2021  This is a 26 y.o. female who presents with Chief Complaint  Patient presents with   Menstrual Problem    X4 months   Shortness of Breath    X2-3 weeks   patient complains of bowel movements 5 min every each meal    History of present illness: Saw gyn Dr. Debroah Loop on 12/28/20. Suggested considering metformin for prediabetes/pcos and cycle control with ocp. She had difficulty with metformin tolerance in past. Ocp that she was given caused severe cramps. (Nordette) Last visit with me was 02/2020.  Had appointment earlier but had to cancel due to lack of time off.   Menstrual: not taking anything for period control. Were pretty regular before they stopped. 7-8 days. Stronger cramps that then mellowed out. LMP was 04/2021 was last period. Not sexually active since 10/2020. When she was on the aviane ocp periods stopped.   Bowel movements: friend told her she had IBS since she has to go to the bathroom right after her last bite of food. Doesn't seem to matter what she is eating (healthy or unhealthy). Has not always been this way. Only been constipated twice she can recall. Since summer time 2022 started to notice more regularly. Still has loose stools even if she doesn't eat. Having 3-4 bm/day. Started chloraphyl in November,but even before this was having loose stools. Sometimes some nausea. Tried switching up milk. Does have some nausea; sensitive to smells/odors.   She was considering weight loss surgery, but started with weight loss specialist in may 2022 but then towards end of year she got really sick with pneumonia/resp illness and moved to [place] with mom to recover.   Shortness of breath, slight back pain. Working on getting steps in. 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 More sedentary, so works to try and fit in steps regularly. Tracking walking. Did better at home because mom made her get out of the house more. Didn't  think she had gained weight, but now feels this could be related.   Sleep apnea: appointment was scheduled but hours didn't work with her work schedule. Doesn't have machine now. First sleep study was in teen years. Felt better rested with machine. Hasn't had eval since back in [place].    No Known Allergies Current Meds  Medication Sig   albuterol (VENTOLIN HFA) 108 (90 Base) MCG/ACT inhaler Inhale 1-2 puffs into the lungs every 6 (six) hours as needed for wheezing or shortness of breath.   desogestrel-ethinyl estradiol (MIRCETTE) 0.15-0.02/0.01 MG (21/5) tablet Take 1 tablet by mouth daily.   FLOVENT HFA 44 MCG/ACT inhaler INHALE 2 PUFFS INTO THE LUNGS IN THE MORNING AND AT BEDTIME   [DISCONTINUED] doxycycline (VIBRA-TABS) 100 MG tablet Take 1 tablet (100 mg total) by mouth 2 (two) times daily.   [DISCONTINUED] fluticasone (FLONASE) 50 MCG/ACT nasal spray Place 1 spray into both nostrils daily.   [DISCONTINUED] furosemide (LASIX) 20 MG tablet Take 1 tablet (20 mg total) by mouth daily as needed for edema.   [DISCONTINUED] levonorgestrel-ethinyl estradiol (NORDETTE) 0.15-30 MG-MCG tablet Take 1 tablet by mouth daily.   [DISCONTINUED] metFORMIN (GLUCOPHAGE) 500 MG tablet Take 2 tablets (1,000 mg total) by mouth 2 (two) times daily with a meal.   [DISCONTINUED] metroNIDAZOLE (FLAGYL) 500 MG tablet Take 1 tablet (500 mg total) by mouth 2 (two) times daily.   [DISCONTINUED] VITAMIN D PO Take 50,000 Units by mouth once a week.    Review of Systems  Constitutional:  Negative  for chills, fatigue and fever.  Respiratory:  Positive for shortness of breath (feels like exercise tolerance has decreased. notes that with leaving apartment to walk trash to can (?a block), she will be winded when she gets back home.). Negative for cough, chest tightness and wheezing.   Cardiovascular:  Negative for chest pain, palpitations and leg swelling.  Genitourinary:  Positive for menstrual problem (see hpi).    Objective:  BP 112/70 (BP Location: Left Arm, Patient Position: Sitting, Cuff Size: Large)    Pulse (!) 112    Temp 98.1 F (36.7 C) (Oral)    Ht 5\' 1"  (1.549 m)    Wt (!) 381 lb 11.2 oz (173.1 kg)    LMP 04/21/2021    SpO2 95%    BMI 72.12 kg/m   Weight: (!) 381 lb 11.2 oz (173.1 kg)   BP Readings from Last 3 Encounters:  09/01/21 112/70  12/28/20 126/79  03/17/20 123/72   Wt Readings from Last 3 Encounters:  09/01/21 (!) 381 lb 11.2 oz (173.1 kg)  12/28/20 (!) 373 lb 1.6 oz (169.2 kg)  03/17/20 (!) 346 lb (156.9 kg)    Physical Exam Constitutional:      General: She is not in acute distress.    Appearance: She is well-developed. She is obese.  Cardiovascular:     Rate and Rhythm: Normal rate and regular rhythm.     Heart sounds: Normal heart sounds. No murmur heard.   No friction rub.  Pulmonary:     Effort: Pulmonary effort is normal. No respiratory distress.     Breath sounds: Normal breath sounds. No wheezing or rales.  Musculoskeletal:     Right lower leg: No edema.     Left lower leg: No edema.  Neurological:     Mental Status: She is alert and oriented to person, place, and time.  Psychiatric:        Behavior: Behavior normal.    Assessment/Plan  1. Impaired fasting blood sugar Patient blood work today.  She may benefit from medication to help in terms of weight loss.  We can determine this pending blood work results. - Hemoglobin A1c; Future  2. Diarrhea, unspecified type Discussed eating lactose-free since she has had looser stools.  We will check some blood work today as well. - Comprehensive metabolic panel; Future - CBC with Differential/Platelet; Future - TSH; Future - Celiac Panel 10; Future  3. Morbid obesity (HCC) She did feel that she was benefiting from regularly meeting with weight loss clinic.  We discussed a single thing to work on today, which is decreasing the amount of milk she is drinking.  She is getting at least 500 additional  calories daily from milk, usually more.  Discussed working on more water drinking.  Discussed avoiding smoothies, which can be easy ways for her to get excess carbohydrates.  She would like to follow back up with weight loss clinic so that she can stay on track with weight loss.  4. PCOS (polycystic ovarian syndrome) Due to side effects with prior OCP use, we are going to try Mircette.  5. Obstructive sleep apnea syndrome - Ambulatory referral to Sleep Studies  6. Bilateral lower extremity edema Lasix for as needed use. - furosemide (LASIX) 20 MG tablet; Take 1 tablet (20 mg total) by mouth daily as needed for edema.  Dispense: 90 tablet; Refill: 1  7. Amenorrhea - HCG, Qualitative; Future  8. Lipid screening - Lipid panel; Future  Return for keep follow up  visit. spent in chart review, time with patient, review of treatment options for menses, weight loss, discussion of dietary tips to help with weight loss.     Theodis Shove, MD

## 2021-09-07 ENCOUNTER — Other Ambulatory Visit: Payer: Medicaid Other

## 2021-09-15 ENCOUNTER — Telehealth: Payer: Self-pay | Admitting: *Deleted

## 2021-09-15 ENCOUNTER — Other Ambulatory Visit: Payer: BC Managed Care – PPO

## 2021-09-15 ENCOUNTER — Ambulatory Visit: Payer: BC Managed Care – PPO | Admitting: Family Medicine

## 2021-09-15 DIAGNOSIS — G4733 Obstructive sleep apnea (adult) (pediatric): Secondary | ICD-10-CM

## 2021-09-15 NOTE — Telephone Encounter (Signed)
Order entered per request from PCP under referral for neurology.

## 2021-09-22 ENCOUNTER — Ambulatory Visit: Payer: BC Managed Care – PPO | Admitting: Family Medicine

## 2021-10-13 ENCOUNTER — Encounter: Payer: Self-pay | Admitting: Adult Health

## 2021-10-13 ENCOUNTER — Ambulatory Visit (INDEPENDENT_AMBULATORY_CARE_PROVIDER_SITE_OTHER): Payer: 59 | Admitting: Adult Health

## 2021-10-13 ENCOUNTER — Other Ambulatory Visit: Payer: Self-pay

## 2021-10-13 VITALS — BP 130/80 | HR 98 | Temp 98.0°F | Ht 61.25 in | Wt 378.6 lb

## 2021-10-13 DIAGNOSIS — G4719 Other hypersomnia: Secondary | ICD-10-CM | POA: Diagnosis not present

## 2021-10-13 DIAGNOSIS — G4730 Sleep apnea, unspecified: Secondary | ICD-10-CM

## 2021-10-13 NOTE — Assessment & Plan Note (Signed)
Patient has morbid obesity.  BMI is at 70. Referral to that healthy weight and wellness. Healthy weight loss discussed with patient

## 2021-10-13 NOTE — Progress Notes (Signed)
@Patient  ID: Colleen Webb, female    DOB: 09/25/1995, 26 y.o.   MRN: [id number]  Chief Complaint  Patient presents with   Consult    Referring provider: Caren Macadam, MD  HPI: 26 year old female seen for sleep consult October 13, 2021 for snoring, daytime sleepiness and restless sleep.  TEST/EVENTS :   10/13/2021 Sleep consult  Patient presents for a sleep consult.  Patient complains of ongoing snoring, daytime sleepiness and restless sleep.  Epworth score is 8 Patient goes to bed at 10pm  gets up 3-4 times , wakes up at 8 am . Feels sleepy throughout day, if she is still can fall asleep . 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  No symptoms suspicious for cataplexy or sleep paralysis. Wakes up tired and unrefreshed.  Caffeine intake minimal. No sleep  Current weight is 381 pounds. Previous diagnosed with OSA at age 26, wore for couple years,. Stopped 10 years ago. Felt it really worked when she wore before.  Has PCOS . Has been overweight her adult life.   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 Never smoker. Social Alcohol.  No drugs. Previously from [place] .   Family history: CAD , DM ,   Surgical history: Tonsillectomy    No Known Allergies  Immunization History  Administered Date(s) Administered   Influenza,inj,Quad PF,6+ Mos 05/26/2021   Tdap 08/27/2014    Past Medical History:  Diagnosis Date   Anxiety    Back pain    Chest pain    Complaints of total body pain    Depression    Edema of both lower extremities    Joint pain    Low self esteem    PCOS (polycystic ovarian syndrome)    PCOS (polycystic ovarian syndrome)    Sleep apnea    Sleep disorder    SOB (shortness of breath)    Stomach ulcer    Vitamin D deficiency     Tobacco History: Social History   Tobacco Use  Smoking Status Never  Smokeless Tobacco Never   Counseling given: Not Answered   Outpatient Medications Prior to Visit  Medication Sig Dispense  Refill   albuterol (VENTOLIN HFA) 108 (90 Base) MCG/ACT inhaler Inhale 1-2 puffs into the lungs every 6 (six) hours as needed for wheezing or shortness of breath. 18 g 1   desogestrel-ethinyl estradiol (MIRCETTE) 0.15-0.02/0.01 MG (21/5) tablet Take 1 tablet by mouth daily. 84 tablet 1   FLOVENT HFA 44 MCG/ACT inhaler INHALE 2 PUFFS INTO THE LUNGS IN THE MORNING AND AT BEDTIME 10.6 g 5   furosemide (LASIX) 20 MG tablet Take 1 tablet (20 mg total) by mouth daily as needed for edema. 90 tablet 1   No facility-administered medications prior to visit.     Review of Systems:   Constitutional:   No  weight loss, night sweats,  Fevers, chills, + fatigue, or  lassitude.  HEENT:   No headaches,  Difficulty swallowing,  Tooth/dental problems, or  Sore throat,                No sneezing, itching, ear ache, nasal congestion, post nasal drip,   CV:  No chest pain,  Orthopnea, PND, swelling in lower extremities, anasarca, dizziness, palpitations, syncope.   GI  No heartburn, indigestion, abdominal pain, nausea, vomiting, diarrhea, change in bowel habits, loss of appetite, bloody stools.   Resp: No shortness of breath with exertion or at rest.  No excess mucus, no productive cough,  No non-productive cough,  No coughing up of blood.  No change in color of mucus.  No wheezing.  No chest wall deformity  Skin: no rash or lesions.  GU: no dysuria, change in color of urine, no urgency or frequency.  No flank pain, no hematuria   MS:  No joint pain or swelling.  No decreased range of motion.  No back pain.    Physical Exam  BP 130/80 (BP Location: Left Wrist, Patient Position: Sitting, Cuff Size: Normal)    Pulse 98    Temp 98 F (36.7 C) (Oral)    Ht 5' 1.25" (1.556 m)    Wt (!) 378 lb 9.6 oz (171.7 kg)    SpO2 90% Comment: on RA after ambulation, up to 95% after rest   BMI 70.95 kg/m   GEN: A/Ox3; pleasant , NAD, BMI 70    HEENT:  [place]/AT,  EACs-clear, TMs-wnl, NOSE-clear, THROAT-clear, no  lesions, no postnasal drip or exudate noted. Class 3 MP airway   NECK:  Supple w/ fair ROM; no JVD; normal carotid impulses w/o bruits; no thyromegaly or nodules palpated; no lymphadenopathy.    RESP  Clear  P & A; w/o, wheezes/ rales/ or rhonchi. no accessory muscle use, no dullness to percussion  CARD:  RRR, no m/r/g, 1+ peripheral edema, pulses intact, no cyanosis or clubbing. Stasis dermatatic changes   GI:   Soft & nt; nml bowel sounds; no organomegaly or masses detected.   Musco: Warm bil, no deformities or joint swelling noted.   Neuro: alert, no focal deficits noted.    Skin: Warm, no lesions or rashes    Lab Results:  CBC   BNP No results found for: BNP  ProBNP No results found for: PROBNP  Imaging: No results found.    No flowsheet data found.  No results found for: NITRICOXIDE      Assessment & Plan:   Excessive daytime sleepiness Excessive daytime sleepiness, snoring, restless sleep, BMI at 70 and previous diagnosis of sleep apnea all suspicious for ongoing sleep apnea.  Patient will need a home sleep study  - discussed how weight can impact sleep and risk for sleep disordered breathing - discussed options to assist with weight loss: combination of diet modification, cardiovascular and strength training exercises   - had an extensive discussion regarding the adverse health consequences related to untreated sleep disordered breathing - specifically discussed the risks for hypertension, coronary artery disease, cardiac dysrhythmias, cerebrovascular disease, and diabetes - lifestyle modification discussed   - discussed how sleep disruption can increase risk of accidents, particularly when driving - safe driving practices were discussed    Plan  Patient Instructions  Set up for home sleep study Healthy sleep regimen Do not drive if sleepy Refer to healthy weight and wellness.  Follow-up in 6 to 8 weeks to discuss results of sleep study.       Morbid obesity ([facility]) Patient has morbid obesity.  BMI is at 70. Referral to that healthy weight and wellness. Healthy weight loss discussed with patient     Rexene Edison, NP 10/13/2021

## 2021-10-13 NOTE — Patient Instructions (Addendum)
Set up for home sleep study Healthy sleep regimen Do not drive if sleepy Refer to healthy weight and wellness.  Follow-up in 6 to 8 weeks to discuss results of sleep study.

## 2021-10-13 NOTE — Addendum Note (Signed)
Addended by: Vanessa Barbara on: 10/13/2021 05:44 PM   Modules accepted: Orders

## 2021-10-13 NOTE — Assessment & Plan Note (Signed)
Excessive daytime sleepiness, snoring, restless sleep, BMI at 70 and previous diagnosis of sleep apnea all suspicious for ongoing sleep apnea.  Patient will need a home sleep study  - discussed how weight can impact sleep and risk for sleep disordered breathing - discussed options to assist with weight loss: combination of diet modification, cardiovascular and strength training exercises   - had an extensive discussion regarding the adverse health consequences related to untreated sleep disordered breathing - specifically discussed the risks for hypertension, coronary artery disease, cardiac dysrhythmias, cerebrovascular disease, and diabetes - lifestyle modification discussed   - discussed how sleep disruption can increase risk of accidents, particularly when driving - safe driving practices were discussed    Plan  Patient Instructions  Set up for home sleep study Healthy sleep regimen Do not drive if sleepy Refer to healthy weight and wellness.  Follow-up in 6 to 8 weeks to discuss results of sleep study.

## 2021-10-27 ENCOUNTER — Encounter: Payer: Self-pay | Admitting: Family Medicine

## 2021-11-20 ENCOUNTER — Encounter: Payer: Self-pay | Admitting: *Deleted

## 2021-11-20 ENCOUNTER — Telehealth: Payer: Self-pay | Admitting: *Deleted

## 2021-11-20 NOTE — Telephone Encounter (Signed)
Called pt and she did not answer. I left message on her personal voicemail stating that her pharmacy has informed us that the Rx for Kurvelo (norgestimate birth control) has expired. Pt was requested to contact us if she desires refill. Also she is due for Pap test in May and needs to schedule the appointment.  Mychart message also sent.  ?

## 2021-12-07 ENCOUNTER — Other Ambulatory Visit: Payer: Self-pay | Admitting: *Deleted

## 2021-12-07 NOTE — Telephone Encounter (Signed)
Rx denial sent via escribe as the patient needs an appt. ?

## 2021-12-22 ENCOUNTER — Telehealth: Payer: Self-pay | Admitting: *Deleted

## 2021-12-22 ENCOUNTER — Ambulatory Visit: Payer: 59 | Admitting: Adult Health

## 2021-12-22 NOTE — Telephone Encounter (Signed)
ATC patient x1 to advise that we will reschedule her OV after she completes her HST (DPR).  Left VM that she will not need to come to appointment this afternoon at 4:30 pm since she has not had her HST yet.  Advised to call the office with any questions. ?

## 2022-01-02 ENCOUNTER — Encounter: Payer: Self-pay | Admitting: Family Medicine

## 2022-01-05 ENCOUNTER — Other Ambulatory Visit: Payer: Self-pay | Admitting: Family Medicine

## 2022-01-05 DIAGNOSIS — J45909 Unspecified asthma, uncomplicated: Secondary | ICD-10-CM

## 2022-01-05 MED ORDER — ALBUTEROL SULFATE HFA 108 (90 BASE) MCG/ACT IN AERS: 1.0000 | INHALATION_SPRAY | Freq: Four times a day (QID) | RESPIRATORY_TRACT | 2 refills | Status: AC | PRN

## 2022-01-05 MED ORDER — FLUTICASONE PROPIONATE HFA 44 MCG/ACT IN AERO: 2.0000 | INHALATION_SPRAY | Freq: Two times a day (BID) | RESPIRATORY_TRACT | 5 refills | Status: DC

## 2022-01-07 ENCOUNTER — Other Ambulatory Visit: Payer: Self-pay | Admitting: Family Medicine

## 2022-01-07 MED ORDER — FLUTICASONE PROPIONATE HFA 44 MCG/ACT IN AERO: 2.0000 | INHALATION_SPRAY | Freq: Two times a day (BID) | RESPIRATORY_TRACT | 5 refills | Status: AC

## 2022-04-04 ENCOUNTER — Encounter (INDEPENDENT_AMBULATORY_CARE_PROVIDER_SITE_OTHER): Payer: Self-pay

## 2022-04-11 NOTE — Telephone Encounter (Signed)
Closing encounter
